# Patient Record
Sex: Female | Born: 1970 | Race: Black or African American | Hispanic: No | Smoking: Never smoker
Health system: Southern US, Community
[De-identification: ages and names within clinical notes are randomized; demographics above are authoritative.]

## PROBLEM LIST (undated history)

## (undated) ENCOUNTER — Emergency Department (HOSPITAL_COMMUNITY): Payer: Self-pay | Source: Home / Self Care

## (undated) ENCOUNTER — Inpatient Hospital Stay (HOSPITAL_COMMUNITY): Payer: Self-pay

## (undated) DIAGNOSIS — M1990 Unspecified osteoarthritis, unspecified site: Secondary | ICD-10-CM

## (undated) DIAGNOSIS — T7840XA Allergy, unspecified, initial encounter: Secondary | ICD-10-CM

## (undated) DIAGNOSIS — G4730 Sleep apnea, unspecified: Secondary | ICD-10-CM

## (undated) DIAGNOSIS — H4710 Unspecified papilledema: Secondary | ICD-10-CM

## (undated) DIAGNOSIS — E559 Vitamin D deficiency, unspecified: Secondary | ICD-10-CM

## (undated) HISTORY — DX: Unspecified papilledema: H47.10

## (undated) HISTORY — DX: Sleep apnea, unspecified: G47.30

## (undated) HISTORY — DX: Allergy, unspecified, initial encounter: T78.40XA

## (undated) HISTORY — DX: Unspecified osteoarthritis, unspecified site: M19.90

## (undated) HISTORY — DX: Vitamin D deficiency, unspecified: E55.9

---

## 1999-08-13 ENCOUNTER — Other Ambulatory Visit: Admission: RE | Admit: 1999-08-13 | Discharge: 1999-08-13 | Payer: Self-pay | Admitting: Obstetrics and Gynecology

## 1999-12-11 ENCOUNTER — Encounter: Payer: Self-pay | Admitting: Obstetrics and Gynecology

## 1999-12-11 ENCOUNTER — Ambulatory Visit (HOSPITAL_COMMUNITY): Admission: RE | Admit: 1999-12-11 | Discharge: 1999-12-11 | Payer: Self-pay | Admitting: Obstetrics and Gynecology

## 2000-02-03 ENCOUNTER — Ambulatory Visit (HOSPITAL_COMMUNITY): Admission: RE | Admit: 2000-02-03 | Discharge: 2000-02-03 | Payer: Self-pay | Admitting: Obstetrics and Gynecology

## 2000-02-03 ENCOUNTER — Encounter: Payer: Self-pay | Admitting: Obstetrics and Gynecology

## 2000-05-02 ENCOUNTER — Inpatient Hospital Stay (HOSPITAL_COMMUNITY): Admission: AD | Admit: 2000-05-02 | Discharge: 2000-05-04 | Payer: Self-pay | Admitting: Obstetrics and Gynecology

## 2000-08-13 ENCOUNTER — Other Ambulatory Visit: Admission: RE | Admit: 2000-08-13 | Discharge: 2000-08-13 | Payer: Self-pay | Admitting: Obstetrics and Gynecology

## 2000-09-05 ENCOUNTER — Emergency Department (HOSPITAL_COMMUNITY): Admission: EM | Admit: 2000-09-05 | Discharge: 2000-09-05 | Payer: Self-pay | Admitting: Emergency Medicine

## 2001-07-07 ENCOUNTER — Other Ambulatory Visit: Admission: RE | Admit: 2001-07-07 | Discharge: 2001-07-07 | Payer: Self-pay | Admitting: Obstetrics and Gynecology

## 2002-09-07 ENCOUNTER — Other Ambulatory Visit: Admission: RE | Admit: 2002-09-07 | Discharge: 2002-09-07 | Payer: Self-pay | Admitting: Obstetrics and Gynecology

## 2003-10-25 ENCOUNTER — Other Ambulatory Visit: Admission: RE | Admit: 2003-10-25 | Discharge: 2003-10-25 | Payer: Self-pay | Admitting: Obstetrics and Gynecology

## 2004-10-31 ENCOUNTER — Other Ambulatory Visit: Admission: RE | Admit: 2004-10-31 | Discharge: 2004-10-31 | Payer: Self-pay | Admitting: Obstetrics and Gynecology

## 2005-11-03 ENCOUNTER — Other Ambulatory Visit: Admission: RE | Admit: 2005-11-03 | Discharge: 2005-11-03 | Payer: Self-pay | Admitting: Obstetrics and Gynecology

## 2006-07-31 ENCOUNTER — Emergency Department (HOSPITAL_COMMUNITY): Admission: EM | Admit: 2006-07-31 | Discharge: 2006-07-31 | Payer: Self-pay | Admitting: Family Medicine

## 2009-08-07 ENCOUNTER — Emergency Department (HOSPITAL_COMMUNITY): Admission: EM | Admit: 2009-08-07 | Discharge: 2009-08-07 | Payer: Self-pay | Admitting: Emergency Medicine

## 2010-06-25 LAB — POCT URINALYSIS DIP (DEVICE)
Glucose, UA: NEGATIVE mg/dL
Hgb urine dipstick: NEGATIVE
Nitrite: NEGATIVE
Protein, ur: 30 mg/dL — AB
Specific Gravity, Urine: >=1.03 (ref 1.005–1.030)
Urobilinogen, UA: 2 mg/dL — ABNORMAL HIGH (ref 0.0–1.0)
pH: 5.5 (ref 5.0–8.0)

## 2010-06-25 LAB — URINE CULTURE: Colony Count: 35000
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
## 2011-10-29 ENCOUNTER — Telehealth: Payer: Self-pay | Admitting: Obstetrics and Gynecology

## 2011-10-29 NOTE — Telephone Encounter (Signed)
sr pt 

## 2011-11-04 ENCOUNTER — Other Ambulatory Visit: Payer: Self-pay | Admitting: Obstetrics and Gynecology

## 2011-11-04 ENCOUNTER — Telehealth: Payer: Self-pay

## 2011-11-04 DIAGNOSIS — Z1231 Encounter for screening mammogram for malignant neoplasm of breast: Secondary | ICD-10-CM

## 2011-11-04 NOTE — Telephone Encounter (Signed)
Pt called requesting info on where to get mmg done.  Gave pt [facility] phone number so pt can schedule at her convenience.  ld

## 2011-11-06 ENCOUNTER — Other Ambulatory Visit: Payer: Self-pay | Admitting: Podiatry

## 2011-11-06 DIAGNOSIS — R52 Pain, unspecified: Secondary | ICD-10-CM

## 2011-11-10 ENCOUNTER — Other Ambulatory Visit: Payer: Self-pay

## 2011-11-12 ENCOUNTER — Ambulatory Visit
Admission: RE | Admit: 2011-11-12 | Discharge: 2011-11-12 | Disposition: A | Discharge status: Home / Self Care | Payer: BC Managed Care – PPO | Source: Ambulatory Visit | Attending: Podiatry | Admitting: Podiatry

## 2011-11-12 DIAGNOSIS — R52 Pain, unspecified: Secondary | ICD-10-CM

## 2011-11-18 ENCOUNTER — Ambulatory Visit
Admission: RE | Admit: 2011-11-18 | Discharge: 2011-11-18 | Disposition: A | Discharge status: Home / Self Care | Payer: BC Managed Care – PPO | Source: Ambulatory Visit | Attending: Obstetrics and Gynecology | Admitting: Obstetrics and Gynecology

## 2011-11-18 DIAGNOSIS — Z1231 Encounter for screening mammogram for malignant neoplasm of breast: Secondary | ICD-10-CM

## 2012-04-28 ENCOUNTER — Ambulatory Visit: Payer: BC Managed Care – PPO | Admitting: Obstetrics and Gynecology

## 2012-04-28 ENCOUNTER — Encounter: Payer: Self-pay | Admitting: Obstetrics and Gynecology

## 2012-04-28 VITALS — BP 126/80 | Ht 68.0 in | Wt 293.0 lb

## 2012-04-28 DIAGNOSIS — Z01419 Encounter for gynecological examination (general) (routine) without abnormal findings: Secondary | ICD-10-CM

## 2012-04-28 DIAGNOSIS — N912 Amenorrhea, unspecified: Secondary | ICD-10-CM

## 2012-04-28 DIAGNOSIS — Z124 Encounter for screening for malignant neoplasm of cervix: Secondary | ICD-10-CM

## 2012-04-28 LAB — THYROID PANEL WITH TSH
Free Thyroxine Index: 2.6 (ref 1.0–3.9)
TSH: 1.142 u[IU]/mL (ref 0.350–4.500)

## 2012-04-28 LAB — POCT URINE PREGNANCY: Preg Test, Ur: NEGATIVE

## 2012-04-28 MED ORDER — MEDROXYPROGESTERONE ACETATE 10 MG PO TABS: ORAL_TABLET | ORAL | Status: DC

## 2012-04-28 NOTE — Progress Notes (Signed)
The patient reports:irregular cycles  Contraception:None   Last mammogram: 11/19/2011 Normal Last pap: 04/24/2011 Normal  GC/Chlamydia cultures offered: declined HIV/RPR/HbsAg offered:  declined HSV 1 and 2 glycoprotein offered: declined  Menstrual cycle regular and monthly: No Pt states last cycle was October 28,2013 but pt feels breast tenderness and mild cramping but no cycle  Menstrual flow normal: No it is really heavy lasts 3-4 days    Urinary symptoms: none Normal bowel movements: Yes Reports abuse at home: No:   Subjective:    Katherine Conway is a 42 y.o. female, G3P2, who presents for an annual exam.     History   Social History  . Marital Status: Married    Spouse Name: N/A    Number of Children: N/A  . Years of Education: N/A   Social History Main Topics  . Smoking status: Never Smoker   . Smokeless tobacco: Never Used  . Alcohol Use: Yes     Comment: occasional wine   . Drug Use: No  . Sexually Active: Not Currently   Other Topics Concern  . None   Social History Narrative  . None    Menstrual cycle:   LMP: Patient's last menstrual period was 01/22/2012.           Cycle: Irregular cycles. No cycle since 01/2012  The following portions of the patient's history were reviewed and updated as appropriate: allergies, current medications, past family history, past medical history, past social history, past surgical history and problem list.  Review of Systems Pertinent items are noted in HPI. Breast:Negative for breast lump,nipple discharge or nipple retraction Gastrointestinal: Negative for abdominal pain, change in bowel habits or rectal bleeding Urinary:negative   Objective:    BP 126/80  Ht 5\' 8"  (1.727 m)  Wt 293 lb (132.904 kg)  BMI 44.55 kg/m2  LMP 01/22/2012    Weight:  Wt Readings from Last 1 Encounters:  04/28/12 293 lb (132.904 kg)          BMI: Body mass index is 44.55 kg/(m^2).  General Appearance: Alert, appropriate appearance for  age. No acute distress HEENT: Grossly normal Neck / Thyroid: Supple, no masses, nodes or enlargement Lungs: clear to auscultation bilaterally Back: No CVA tenderness Breast Exam: No dimpling, nipple retraction or discharge. No masses or nodes. and No masses or nodes.No dimpling, nipple retraction or discharge. Cardiovascular: Regular rate and rhythm. S1, S2, no murmur Gastrointestinal: Soft, non-tender, no masses or organomegaly Pelvic Exam: Vulva and vagina appear normal. Bimanual exam reveals normal uterus and adnexa. Rectovaginal: normal rectal, no masses Lymphatic Exam: Non-palpable nodes in neck, clavicular, axillary, or inguinal regions Skin: no rash or abnormalities Neurologic: Normal gait and speech, no tremor  Psychiatric: Alert and oriented, appropriate affect.   Assessment:    Normal gyn exam  Anovulatory bleeding   Plan:    mammogram pap smear return annually or prn STD screening: declined Contraception:no method   15  minutes face-to-face discussion on ANOVULATORY BLEEDING / PCOS with:  1. Etiology: hormonal vs unexplained 2. Need to cycle every 60-90 days to avoid hyperplasia 3. May resolve on its own 4. May need ovulation induction if desires pregnancy 5. May choose to treat with cyclic progesterone supplementation monthly or PRN 6. Can be associated with increased female hormone production with subsequent excessive hair growth 7. Can be associated with metabolic syndrome +/- Insulin resistance   Thyroid Panel with TSH, Prolactin, Progesterone Provera 10 Mg qhs x 10 days, every 60 days prn  Silverio Lay MD

## 2012-04-29 LAB — PAP IG W/ RFLX HPV ASCU

## 2012-04-29 LAB — PROLACTIN: Prolactin: 6.3 ng/mL

## 2012-05-17 ENCOUNTER — Telehealth: Payer: Self-pay | Admitting: Obstetrics and Gynecology

## 2012-05-17 DIAGNOSIS — N912 Amenorrhea, unspecified: Secondary | ICD-10-CM

## 2012-05-17 NOTE — Telephone Encounter (Signed)
LVM for pt to return call.   Jeshawn Melucci, CMA  

## 2012-05-17 NOTE — Telephone Encounter (Signed)
Please have patient come in for [facility] (unless you can add to previously ordered panel). We normally wait 45 days and repeat a course of Provera if no bleeding. OK to refill x 1

## 2012-05-17 NOTE — Telephone Encounter (Signed)
Spoke with pt. States that she started Progesterone after last visit 01/22 finished on 02/01. States that she has not started a cycle yet. Is having intermittent mild cramping since stopping progesterone. No d/c or bleeding. No other symptoms.   Darien Ramus, CMA

## 2012-05-18 NOTE — Telephone Encounter (Signed)
Advised pt of plan. Scheduled for [facility] on 05/19/12.  Refill sent to pharmacy for refill of Provera. Pt aware to start if no cycle after 45 days  Darien Ramus, CMA

## 2012-05-19 ENCOUNTER — Other Ambulatory Visit: Payer: BC Managed Care – PPO

## 2012-05-19 DIAGNOSIS — N912 Amenorrhea, unspecified: Secondary | ICD-10-CM

## 2012-05-19 LAB — FOLLICLE STIMULATING HORMONE: FSH: 68.5 m[IU]/mL

## 2012-05-22 ENCOUNTER — Other Ambulatory Visit: Payer: Self-pay

## 2012-05-22 NOTE — Progress Notes (Signed)
Quick Note:  FSH compatible with premature menopause. Schedule follow-up after 2nd course of Provera ______

## 2012-05-24 ENCOUNTER — Telehealth: Payer: Self-pay

## 2012-05-24 NOTE — Telephone Encounter (Signed)
LVM for pt to return call.   Rudolf Blizard, CMA  

## 2012-05-24 NOTE — Telephone Encounter (Signed)
Message copied by Darien Ramus on Mon May 24, 2012  9:22 AM ------      Message from: Silverio Lay      Created: Sat May 22, 2012  1:09 PM       [facility] compatible with premature menopause. Schedule follow-up after 2nd course of Provera ------

## 2012-05-25 NOTE — Telephone Encounter (Signed)
Pt aware of lab work. Voiced understanding. Pt will call back when beginning her 2nd dose of provera after 45 days from last dosage to schedule f/u with SR.  Darien Ramus, CMA

## 2012-07-15 ENCOUNTER — Emergency Department (HOSPITAL_COMMUNITY)
Admission: EM | Admit: 2012-07-15 | Discharge: 2012-07-15 | Disposition: A | Discharge status: Home / Self Care | Payer: BC Managed Care – PPO | Source: Home / Self Care

## 2012-07-15 ENCOUNTER — Encounter (HOSPITAL_COMMUNITY): Payer: Self-pay | Admitting: *Deleted

## 2012-07-15 DIAGNOSIS — S39013A Strain of muscle, fascia and tendon of pelvis, initial encounter: Secondary | ICD-10-CM

## 2012-07-15 DIAGNOSIS — S339XXA Sprain of unspecified parts of lumbar spine and pelvis, initial encounter: Secondary | ICD-10-CM

## 2012-07-15 DIAGNOSIS — IMO0002 Reserved for concepts with insufficient information to code with codable children: Secondary | ICD-10-CM

## 2012-07-15 DIAGNOSIS — S39012A Strain of muscle, fascia and tendon of lower back, initial encounter: Secondary | ICD-10-CM

## 2012-07-15 LAB — POCT URINALYSIS DIP (DEVICE)
Glucose, UA: NEGATIVE mg/dL
Hgb urine dipstick: NEGATIVE
Leukocytes, UA: NEGATIVE
Nitrite: NEGATIVE
Urobilinogen, UA: 2 mg/dL — ABNORMAL HIGH (ref 0.0–1.0)

## 2012-07-15 MED ORDER — KETOROLAC TROMETHAMINE 60 MG/2ML IM SOLN
INTRAMUSCULAR | Status: AC
  Filled 2012-07-15: qty 2

## 2012-07-15 MED ORDER — KETOROLAC TROMETHAMINE 60 MG/2ML IM SOLN
60.0000 mg | Freq: Once | INTRAMUSCULAR | Status: AC
  Administered 2012-07-15: 60 mg via INTRAMUSCULAR

## 2012-07-15 MED ORDER — TRAMADOL HCL 50 MG PO TABS: 50.0000 mg | ORAL_TABLET | Freq: Four times a day (QID) | ORAL | Status: DC | PRN

## 2012-07-15 MED ORDER — DICLOFENAC SODIUM 75 MG PO TBEC: 75.0000 mg | DELAYED_RELEASE_TABLET | Freq: Two times a day (BID) | ORAL | Status: DC

## 2012-07-15 NOTE — ED Notes (Signed)
Pt reports frequent urination, lower back pain - denies discharge or any chance of STD exposure - feels similar to last time pt had UTI

## 2012-07-15 NOTE — ED Provider Notes (Signed)
History     CSN: [id number]  Arrival date & time 07/15/12  1151   First MD Initiated Contact with Patient 07/15/12 1249      Chief Complaint  Patient presents with  . Urinary Tract Infection    (Consider location/radiation/quality/duration/timing/severity/associated sxs/prior treatment) HPI Comments: 42 year old morbidly obese female experienced pain in the left inguinal region radiating to the left back last night. The pain is worse with movement in various positions. She is unaware of any known trauma, movement or event that precipitated the pain.   Past Medical History  Diagnosis Date  . Allergy     Past Surgical History  Procedure Laterality Date  . Cesarean section      Family History  Problem Relation Age of Onset  . Hypertension Mother   . Hypertension Father   . Breast cancer Maternal Aunt   . Hypertension Maternal Grandmother   . Hypertension Maternal Grandfather   . Hypertension Paternal Grandmother   . Heart disease Paternal Grandfather   . Hypertension Paternal Grandfather     History  Substance Use Topics  . Smoking status: Never Smoker   . Smokeless tobacco: Never Used  . Alcohol Use: Yes     Comment: occasional wine     OB History   Grav Para Term Preterm Abortions TAB SAB Ect Mult Living   3 2              Review of Systems  Constitutional: Negative for fever, chills and activity change.  HENT: Negative.   Respiratory: Negative.   Cardiovascular: Negative.   Genitourinary: Positive for frequency. Negative for dysuria, urgency, flank pain, decreased urine volume, vaginal bleeding, vaginal discharge, enuresis and pelvic pain.  Musculoskeletal:       As per HPI  Skin: Negative for color change, pallor and rash.  Neurological: Negative.     Allergies  Review of patient's allergies indicates no known allergies.  Home Medications   Current Outpatient Rx  Name  Route  Sig  Dispense  Refill  . diclofenac (VOLTAREN) 75 MG EC tablet  Oral   Take 1 tablet (75 mg total) by mouth 2 (two) times daily. Take with food   20 tablet   0   . Loratadine (CLARITIN PO)   Oral   Take by mouth.         . medroxyPROGESTERone (PROVERA) 10 MG tablet      One po at bedtime x 10 day. Every 60 days prn   30 tablet   4   . naproxen sodium (ANAPROX) 220 MG tablet   Oral   Take 220 mg by mouth 2 (two) times daily with a meal.         . traMADol (ULTRAM) 50 MG tablet   Oral   Take 1 tablet (50 mg total) by mouth every 6 (six) hours as needed for pain.   15 tablet   0     BP 139/83  Pulse 69  Temp(Src) 98.3 F (36.8 C) (Oral)  Resp 18  SpO2 98%  Physical Exam  Nursing note and vitals reviewed. Constitutional: She is oriented to person, place, and time. She appears well-developed and well-nourished. No distress.  HENT:  Head: Normocephalic and atraumatic.  Eyes: EOM are normal.  Neck: Normal range of motion. Neck supple.  Cardiovascular: Normal rate, regular rhythm and normal heart sounds.   Pulmonary/Chest: Effort normal and breath sounds normal.  Abdominal: Soft. She exhibits no distension and no mass. There is no tenderness.  There is no rebound and no guarding.  Musculoskeletal: She exhibits tenderness. She exhibits no edema.  Pain in the left low back and groin is reproduced with leaning forward into the right. There is tenderness along the lower paralumbar and parasacral musculature. When lying down left straight leg raise produces moderate to severe pain in the inguinal ligaments. These ligaments are also tender. No tenderness over the pelvis or suprapubic area.  Neurological: She is alert and oriented to person, place, and time. No cranial nerve deficit.  Skin: Skin is warm and dry.  Psychiatric: She has a normal mood and affect.    ED Course  Procedures (including critical care time)  Labs Reviewed  POCT URINALYSIS DIP (DEVICE) - Abnormal; Notable for the following:    Urobilinogen, UA 2.0 (*)    All  other components within normal limits   No results found.   1. Inguinal muscle strain, initial encounter   2. Lumbosacral strain, initial encounter       MDM  Heat to the areas of soreness. Toradol 60 mg IM Diclofenac 75 mg twice a day with food when necessary pain Tramadol 50 mg Q6 hours when necessary pain #15 Limit is activity which worsened the pain such as climbing stairs, bending excessive walking. Followup with your primary care doctor next week as needed. For any new symptoms problems or worsening may return        Hayden Rasmussen, NP 07/15/12 1350

## 2012-07-15 NOTE — ED Provider Notes (Signed)
Medical screening examination/treatment/procedure(s) were performed by non-physician practitioner and as supervising physician I was immediately available for consultation/collaboration.  Leslee Home, M.D.  Reuben Likes, MD 07/15/12 2209

## 2013-02-10 ENCOUNTER — Other Ambulatory Visit: Payer: Self-pay

## 2013-09-29 ENCOUNTER — Other Ambulatory Visit: Payer: Self-pay

## 2013-09-29 DIAGNOSIS — Z1231 Encounter for screening mammogram for malignant neoplasm of breast: Secondary | ICD-10-CM

## 2013-10-05 ENCOUNTER — Ambulatory Visit
Admission: RE | Admit: 2013-10-05 | Discharge: 2013-10-05 | Disposition: A | Discharge status: Home / Self Care | Payer: BC Managed Care – PPO | Source: Ambulatory Visit

## 2013-10-05 DIAGNOSIS — Z1231 Encounter for screening mammogram for malignant neoplasm of breast: Secondary | ICD-10-CM

## 2014-02-06 ENCOUNTER — Encounter (HOSPITAL_COMMUNITY): Payer: Self-pay | Admitting: *Deleted

## 2015-01-27 ENCOUNTER — Ambulatory Visit (INDEPENDENT_AMBULATORY_CARE_PROVIDER_SITE_OTHER): Payer: BC Managed Care – PPO | Admitting: Internal Medicine

## 2015-01-27 ENCOUNTER — Ambulatory Visit (INDEPENDENT_AMBULATORY_CARE_PROVIDER_SITE_OTHER): Payer: BC Managed Care – PPO

## 2015-01-27 VITALS — BP 124/80 | HR 75 | Temp 98.7°F | Resp 18 | Ht 68.5 in | Wt 292.0 lb

## 2015-01-27 DIAGNOSIS — M79674 Pain in right toe(s): Secondary | ICD-10-CM

## 2015-01-27 DIAGNOSIS — W880XXA Exposure to X-rays, initial encounter: Secondary | ICD-10-CM | POA: Diagnosis not present

## 2015-01-27 LAB — POCT URINE PREGNANCY: PREG TEST UR: NEGATIVE

## 2015-01-27 NOTE — Progress Notes (Signed)
   Subjective:    Patient ID: Katherine Conway, female    DOB: 10/15/1970, 44 y.o.   MRN: [id number] This chart was scribed for Ellamae Siaobert Noriah Osgood, MD by Jolene Provostobert Halas, Medical Scribe. This patient was seen in Room 3 and the patient's care was started a 9:11 AMa.  Chief Complaint  Patient presents with  . Toe Injury    last night, right foot, pinky toe    HPI HPI Comments: Katherine Conway is a 44 y.o. female [facility] of pain in her right fifth toe after catching it on furniture while walking around her house last night. She has pain with movement of her toe.   Review of Systems  Constitutional: Negative for fever and chills.  Musculoskeletal: Positive for joint swelling and gait problem.  Skin: Negative for color change and wound.  Neurological: Positive for weakness (Secondary to pain).       Objective:   Physical Exam  Constitutional: She is oriented to person, place, and time. She appears well-developed and well-nourished. No distress.  HENT:  Head: Normocephalic and atraumatic.  Eyes: Pupils are equal, round, and reactive to light.  Neck: Neck supple.  Cardiovascular: Normal rate.   Pulmonary/Chest: Effort normal. No respiratory distress.  Musculoskeletal: Normal range of motion.  Right fifth toe was swollen but not misaligned. There is pain palpation of the MTP and IP joints and pain with any range of motion.  Neurological: She is alert and oriented to person, place, and time. Coordination normal.  Skin: Skin is warm and dry. She is not diaphoretic.  Psychiatric: She has a normal mood and affect. Her behavior is normal.  Nursing note and vitals reviewed.   Filed Vitals:   01/27/15 0854  BP: 124/80  Pulse: 75  Temp: 98.7 F (37.1 C)  Resp: 18  Height: 5' 8.5" (1.74 m)  Weight: 292 lb (132.45 kg)  SpO2: 98%   [facility] reading (PRIMARY) by  Dr. Merla Richesoolittle. No fracture or dislocation.  Results for orders placed or performed in visit on 01/27/15    POCT urine pregnancy  Result Value Ref Range   Preg Test, Ur Negative Negative       Assessment & Plan:  Pain of toe of right foot - Sprain is  Plan: Buddy tape til pain free  footwaer w/out pressure  Ice tid swelling  Exposure to x-rays, initial encounter - Plan: POCT urine pregnancy  I have completed the patient encounter in its entirety as documented by the scribe, with editing by me where necessary. Mandel Seiden P. Merla Richesoolittle, M.D.   By signing my name below, I, Javier Dockerobert Ryan Halas, attest that this documentation has been prepared under the direction and in the presence of Ellamae Siaobert Paulanthony Gleaves, MD. Electronically Signed: Javier Dockerobert Ryan Halas, ER Scribe. 01/27/2015. 9:12 AM.

## 2015-09-05 LAB — HEPATIC FUNCTION PANEL
ALT: 16 (ref 7–35)
AST: 16 (ref 13–35)
Alkaline Phosphatase: 75 (ref 25–125)

## 2015-09-05 LAB — BASIC METABOLIC PANEL
BUN: 19 (ref 4–21)
Glucose: 87
POTASSIUM: 4 (ref 3.4–5.3)
SODIUM: 140 (ref 137–147)

## 2015-09-05 LAB — VITAMIN D 25 HYDROXY (VIT D DEFICIENCY, FRACTURES): VIT D 25 HYDROXY: 7.7

## 2015-09-05 LAB — TSH: TSH: 2.11 (ref 0.41–5.90)

## 2015-09-05 LAB — HEMOGLOBIN A1C: Hemoglobin A1C: 5.9

## 2017-07-15 ENCOUNTER — Ambulatory Visit: Payer: Managed Care, Other (non HMO) | Admitting: Family Medicine

## 2017-07-15 ENCOUNTER — Ambulatory Visit (INDEPENDENT_AMBULATORY_CARE_PROVIDER_SITE_OTHER)
Admission: RE | Admit: 2017-07-15 | Discharge: 2017-07-15 | Disposition: A | Discharge status: Home / Self Care | Payer: Managed Care, Other (non HMO) | Source: Ambulatory Visit | Attending: Family Medicine | Admitting: Family Medicine

## 2017-07-15 ENCOUNTER — Encounter: Payer: Self-pay | Admitting: Family Medicine

## 2017-07-15 ENCOUNTER — Other Ambulatory Visit: Payer: Self-pay

## 2017-07-15 ENCOUNTER — Ambulatory Visit: Payer: Self-pay

## 2017-07-15 VITALS — BP 124/86 | HR 74 | Ht 68.0 in | Wt 309.0 lb

## 2017-07-15 DIAGNOSIS — M25562 Pain in left knee: Principal | ICD-10-CM

## 2017-07-15 DIAGNOSIS — G8929 Other chronic pain: Secondary | ICD-10-CM | POA: Diagnosis not present

## 2017-07-15 DIAGNOSIS — M1712 Unilateral primary osteoarthritis, left knee: Secondary | ICD-10-CM | POA: Insufficient documentation

## 2017-07-15 MED ORDER — DICLOFENAC SODIUM 2 % TD SOLN: 2.0000 g | Freq: Two times a day (BID) | TRANSDERMAL | 3 refills | Status: DC

## 2017-07-15 NOTE — Assessment & Plan Note (Signed)
Patellofemoral.  Discussed icing regimen and home exercises.  Discussed which activities to doing which wants to avoid.  Patient will increase activity as tolerated.  Topical anti-inflammatories prescribed.  X-rays pending secondary to the calcific changes.  Stability brace given.  Follow-up again in 4 weeks.  If still having effusion consider aspiration

## 2017-07-15 NOTE — Progress Notes (Signed)
[name] [address] [place], [place] [address] Phone: [phone] Subjective:      CC: Knee pain, left   NUU:[id number]  Katherine Conway is a 47 y.o. female coming in with complaint of knee pain. Patient is having chronic left knee pain. Her pain is over the anterior portion of the knee. Squatting hurts as well as sitting with her knees flexed. Dull intermittent pain. No history of injury or surgeries. Patient does have a decrease in her pain when she goes to the gym. Wearing heels exacerbates her pain.  Patient has noticed maybe some swelling.  States that she is still able to do daily activities.  Still going into the gym but if she attempts to squat to far she has worsening discomfort and pain.      Past Medical History:  Diagnosis Date  . Allergy    Past Surgical History:  Procedure Laterality Date  . CESAREAN SECTION     Social History   Socioeconomic History  . Marital status: Married    Spouse name: Not on file  . Number of children: Not on file  . Years of education: Not on file  . Highest education level: Not on file  Occupational History  . Not on file  Social Needs  . Financial resource strain: Not on file  . Food insecurity:    Worry: Not on file    Inability: Not on file  . Transportation needs:    Medical: Not on file    Non-medical: Not on file  Tobacco Use  . Smoking status: Never Smoker  . Smokeless tobacco: Never Used  Substance and Sexual Activity  . Alcohol use: Yes    Comment: occasional wine   . Drug use: No  . Sexual activity: Not Currently  Lifestyle  . Physical activity:    Days per week: Not on file    Minutes per session: Not on file  . Stress: Not on file  Relationships  . Social connections:    Talks on phone: Not on file    Gets together: Not on file    Attends religious service: Not on file    Active member of club or organization: Not on file    Attends meetings of clubs or organizations:  Not on file    Relationship status: Not on file  Other Topics Concern  . Not on file  Social History Narrative  . Not on file   No Known Allergies Family History  Problem Relation Age of Onset  . Hypertension Mother   . Hypertension Father   . Breast cancer Maternal Aunt   . Hypertension Maternal Grandmother   . Hypertension Maternal Grandfather   . Hypertension Paternal Grandmother   . Heart disease Paternal Grandfather   . Hypertension Paternal Grandfather      Past medical history, social, surgical and family history all reviewed in electronic medical record.  No pertanent information unless stated regarding to the chief complaint.   Review of Systems:Review of systems updated and as accurate as of 07/15/17  No headache, visual changes, nausea, vomiting, diarrhea, constipation, dizziness, abdominal pain, skin rash, fevers, chills, night sweats, weight loss, swollen lymph nodes, body aches,  chest pain, shortness of breath, mood changes.  Positive muscle aches possible joint swelling  Objective  Blood pressure 124/86, pulse 74, height 5\' 8"  (1.727 m), weight (!) 309 lb (140.2 kg), SpO2 96 %. Systems examined below as of 07/15/17   General: No  apparent distress alert and oriented x3 mood and affect normal, dressed appropriately.  HEENT: Pupils equal, extraocular movements intact  Respiratory: Patient's speak in full sentences and does not appear short of breath  Cardiovascular: No lower extremity edema, non tender, no erythema  Skin: Warm dry intact with no signs of infection or rash on extremities or on axial skeleton.  Abdomen: Soft nontender  Neuro: Cranial nerves II through XII are intact, neurovascularly intact in all extremities with 2+ DTRs and 2+ pulses.  Lymph: No lymphadenopathy of posterior or anterior cervical chain or axillae bilaterally.  Gait normal with good balance and coordination.  MSK:  Non tender with full range of motion and good stability and symmetric  strength and tone of shoulders, elbows, wrist, hip, and ankles bilaterally.  Knee: Left Normal to inspection Difficult to asses secondary to body habitus.  Pain over PT joint lateral  ROM full in flexion and extension and lower leg rotation. Mild laxity of the left MCL but end point noted.  Negative Mcmurray's, Apley's, and Thessalonian tests. Non painful patellar compression. Patellar glide with moderate to severe crepitus. Patellar and quadriceps tendons unremarkable. Hamstring and quadriceps strength is normal.  MSK US performed of: Left This study was ordered, performed, and interpreted by Terrilee FilesZach Smith D.O.  Knee: Moderate effusion noted with calcific changes of the patellofemoral joint.  Significant narrowing of the patellofemoral and moderate narrowing of the medial joint space.  Meniscus appear to be intact.  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  This included exercises focusing on stretching, strengthening, with significant focus on eccentric aspects.   Long term goals include an improvement in range of motion, strength, endurance as well as avoiding reinjury. Patient's frequency would include in 1-2 times a day, 3-5 times a week for a duration of 6-12 weeks. Patellofemoral Syndrome  Reviewed anatomy using anatomical model and how PFS occurs.  Given rehab exercises handout for VMO, hip abductors, core, entire kinetic chain including proprioception exercises including cone touches, step downs, hip elevations and turn outs.  Could benefit from PT, regular exercise, upright biking, and a PFS knee brace to assist with tracking abnormalities.  Proper technique shown and discussed handout in great detail with ATC.  All questions were discussed and answered.      Impression and Recommendations:     This case required  medical decision making of moderate complexity.      Note: This dictation was prepared with Dragon dictation along with smaller phrase technology. Any transcriptional errors that result from this process are unintentional.

## 2017-07-15 NOTE — Patient Instructions (Signed)
Good to see you.  Ice 20 minutes 2 times daily. Usually after activity and before bed. Exercises 3 times a week.  Xray downstairs Over the counter try  Turmeric 500mg  daily  Tart cherry extract 1200mg  at night Wear brace with a lot of activity  See me again in 4-5 weeks

## 2017-08-16 NOTE — Progress Notes (Signed)
Zach Lulabelle Desta D.O. [facility] [address] [place], [place] [address] Phone: [phone] Subjective:     CC: Left knee pain follow-up  BJY:[id number]  NEVEAH BANG is a 47 y.o. female coming in with complaint of knee pain. She has had improvement since last visit. She did wear heels yesterday did cause some pain. She said she has been able to exercise more without pain.  Patient was found to have more of a patellofemoral syndrome.  Patient states that the topical anti-inflammatories and home exercises have been very helpful.  States that she is feeling 75 to 80% better.      Past Medical History:  Diagnosis Date  . Allergy    Past Surgical History:  Procedure Laterality Date  . CESAREAN SECTION     Social History   Socioeconomic History  . Marital status: Married    Spouse name: Not on file  . Number of children: Not on file  . Years of education: Not on file  . Highest education level: Not on file  Occupational History  . Not on file  Social Needs  . Financial resource strain: Not on file  . Food insecurity:    Worry: Not on file    Inability: Not on file  . Transportation needs:    Medical: Not on file    Non-medical: Not on file  Tobacco Use  . Smoking status: Never Smoker  . Smokeless tobacco: Never Used  Substance and Sexual Activity  . Alcohol use: Yes    Comment: occasional wine   . Drug use: No  . Sexual activity: Not Currently  Lifestyle  . Physical activity:    Days per week: Not on file    Minutes per session: Not on file  . Stress: Not on file  Relationships  . Social connections:    Talks on phone: Not on file    Gets together: Not on file    Attends religious service: Not on file    Active member of club or organization: Not on file    Attends meetings of clubs or organizations: Not on file    Relationship status: Not on file  Other Topics Concern  . Not on file  Social History Narrative  . Not on file   No Known  Allergies Family History  Problem Relation Age of Onset  . Hypertension Mother   . Hypertension Father   . Breast cancer Maternal Aunt   . Hypertension Maternal Grandmother   . Hypertension Maternal Grandfather   . Hypertension Paternal Grandmother   . Heart disease Paternal Grandfather   . Hypertension Paternal Grandfather      Past medical history, social, surgical and family history all reviewed in electronic medical record.  No pertanent information unless stated regarding to the chief complaint.   Review of Systems:Review of systems updated and as accurate as of 08/17/17  No headache, visual changes, nausea, vomiting, diarrhea, constipation, dizziness, abdominal pain, skin rash, fevers, chills, night sweats, weight loss, swollen lymph nodes, body aches, joint swelling, muscle aches, chest pain, shortness of breath, mood changes.   Objective  Blood pressure (!) 138/92, pulse 69, height  (1.727 m), weight (!) 307 lb (139.3 kg), SpO2 94 %. Systems examined below as of 08/17/17   General: No apparent distress alert and oriented x3 mood and affect normal, dressed appropriately.  HEENT: Pupils equal, extraocular movements inTawana Scaleatient's speak in full sentences and does not appear short of breath  Cardiovascular: No lower extremity edema, non tender, no erythema  Skin: Warm dry intact with no signs of infection or rash on extremities or on axial skeleton.  Abdomen: Soft nontender  Neuro: Cranial nerves II through XII are intact, neurovascularly intact in all extremities with 2+ DTRs and 2+ pulses.  Lymph: No lymphadenopathy of posterior or anterior cervical chain or axillae bilaterally.  Gait normal with good balance and coordination.  MSK:  Non tender with full range of motion and good stability and symmetric strength and tone of shoulders, elbows, wrist, hip, and ankles bilaterally.   Left knee exam shows the patient still has some mild lateral tracking noted  with some very mild grinding noted.  Positive patella grind.  No swelling of the today.    Impression and Recommendations:     This case required medical decision making of moderate complexity.      Note: This dictation was prepared with Dragon dictation along with smaller phrase technology. Any transcriptional errors that result from this process are unintentional.

## 2017-08-17 ENCOUNTER — Encounter: Payer: Self-pay | Admitting: Family Medicine

## 2017-08-17 ENCOUNTER — Ambulatory Visit: Payer: Managed Care, Other (non HMO) | Admitting: Family Medicine

## 2017-08-17 DIAGNOSIS — M1712 Unilateral primary osteoarthritis, left knee: Secondary | ICD-10-CM | POA: Diagnosis not present

## 2017-08-17 NOTE — Patient Instructions (Signed)
You are awesome! Keep it up  Every pound you lose is 4 pounds to your knees Ice 20 minutes 2 times daily. Usually after activity and before bed. pennsaid pinkie amount topically 2 times daily as needed.  Try to do the exercises 1-2 times a week  See em again in 6 weeks if you need me

## 2017-08-17 NOTE — Assessment & Plan Note (Signed)
Patient is doing remarkably well at this time.  Discussed icing regimen and home exercise.  Discussed which activities to do which wants to avoid.  Patient is to increase activity as tolerated.  Follow-up again in 4 to 6 weeks

## 2017-09-28 ENCOUNTER — Ambulatory Visit: Payer: Managed Care, Other (non HMO) | Admitting: Family Medicine

## 2017-10-19 NOTE — Progress Notes (Signed)
Tawana ScaleZach Nevin Grizzle D.O. [facility] [address] [place], [place] [id number] Phone: [phone] Subjective:     CC: Left knee pain  IEP:PIRJJOACZYHPI:Subjective  Katherine Conway is a 47 y.o. female coming in with complaint of left knee pain. States she sat on the bed and she thinks she twisted it. [place] Had xrays taken and was told it was soft tissue. Has been using an immobilizer and knee brace. Hasn't been bending her knee. No swelling noted.   Onset- Sunday Location- Lateral Duration-   Character- Sharp Aggravating factors- Walking  Reliving factors-  Therapies tried- Ice, aleve Severity-initially 9 out of 10.  Was seen in the emergency room.  Put in a knee immobilizer and was sent here.     Past Medical History:  Diagnosis Date  . Allergy    Past Surgical History:  Procedure Laterality Date  . CESAREAN SECTION     Social History   Socioeconomic History  . Marital status: Divorced    Spouse name: Not on file  . Number of children: Not on file  . Years of education: Not on file  . Highest education level: Not on file  Occupational History  . Not on file  Social Needs  . Financial resource strain: Not on file  . Food insecurity:    Worry: Not on file    Inability: Not on file  . Transportation needs:    Medical: Not on file    Non-medical: Not on file  Tobacco Use  . Smoking status: Never Smoker  . Smokeless tobacco: Never Used  Substance and Sexual Activity  . Alcohol use: Yes    Comment: occasional wine   . Drug use: No  . Sexual activity: Not Currently  Lifestyle  . Physical activity:    Days per week: Not on file    Minutes per session: Not on file  . Stress: Not on file  Relationships  . Social connections:    Talks on phone: Not on file    Gets together: Not on file    Attends religious service: Not on file    Active member of club or organization: Not on file    Attends meetings of clubs or organizations: Not on file   Relationship status: Not on file  Other Topics Concern  . Not on file  Social History Narrative  . Not on file   No Known Allergies Family History  Problem Relation Age of Onset  . Hypertension Mother   . Hypertension Father   . Breast cancer Maternal Aunt   . Hypertension Maternal Grandmother   . Hypertension Maternal Grandfather   . Hypertension Paternal Grandmother   . Heart disease Paternal Grandfather   . Hypertension Paternal Grandfather      Past medical history, social, surgical and family history all reviewed in electronic medical record.  No pertanent information unless stated regarding to the chief complaint.   Review of Systems:Review of systems updated and as accurate as of 10/20/17  No headache, visual changes, nausea, vomiting, diarrhea, constipation, dizziness, abdominal pain, skin rash, fevers, chills, night sweats, weight loss, swollen lymph nodes, body aches, joint swelling, muscle aches, chest pain, shortness of breath, mood changes.   Objective  Blood pressure 124/80, pulse 84, height 5\' 8"  (1.727 m), weight (!) 310 lb (140.6 kg), SpO2 97 %. Systems examined below as of 10/20/17   General: No apparent distress alert and oriented x3 mood and affect normal, dressed  appropriately.  HEENT: Pupils equal, extraocular movements intact  Respiratory: Patient's speak in full sentences and does not appear short of breath  Cardiovascular: No lower extremity edema, non tender, no erythema  Skin: Warm dry intact with no signs of infection or rash on extremities or on axial skeleton.  Abdomen: Soft nontender  Neuro: Cranial nerves II through XII are intact, neurovascularly intact in all extremities with 2+ DTRs and 2+ pulses.  Lymph: No lymphadenopathy of posterior or anterior cervical chain or axillae bilaterally.  Gait severely antalgic MSK:  Non tender with full range of motion and good stability and symmetric strength and tone of shoulders, elbows, wrist, hip, and  ankles bilaterally.  Patient's knee exam on the left side shows a trace amount of swelling.  Patient has flexion to 95 degrees but does have significant discomfort.  Lateral tracking of the kneecap noted.  Mild crepitus noted.  Positive patella grind noted.  Stability of the knee noted.  Contralateral knee mild arthritic changes.  Limited musculoskeletal ultrasound was performed and interpreted Judi Saa  Limited musculoskeletal ultrasound shows that patient does have trace effusion as well as some calcific loose bodies noted of the lateral patellofemoral joint.  Seems to be in the elbow extra-articular.  Narrowing of the medial joint line is previously seen.  No acute fracture of the patella though noted. Impression: Small joint effusion with possible loose bodies and moderate to severe arthritic changes of the patellofemoral joint     Impression and Recommendations:     This case required medical decision making of moderate complexity.      Note: This dictation was prepared with Dragon dictation along with smaller phrase technology. Any transcriptional errors that result from this process are unintentional.

## 2017-10-20 ENCOUNTER — Ambulatory Visit: Payer: Self-pay

## 2017-10-20 ENCOUNTER — Encounter: Payer: Self-pay | Admitting: Family Medicine

## 2017-10-20 ENCOUNTER — Ambulatory Visit: Payer: Managed Care, Other (non HMO) | Admitting: Family Medicine

## 2017-10-20 ENCOUNTER — Ambulatory Visit (INDEPENDENT_AMBULATORY_CARE_PROVIDER_SITE_OTHER)
Admission: RE | Admit: 2017-10-20 | Discharge: 2017-10-20 | Disposition: A | Discharge status: Home / Self Care | Payer: Managed Care, Other (non HMO) | Source: Ambulatory Visit | Attending: Family Medicine | Admitting: Family Medicine

## 2017-10-20 VITALS — BP 124/80 | HR 84 | Ht 68.0 in | Wt 310.0 lb

## 2017-10-20 DIAGNOSIS — M1712 Unilateral primary osteoarthritis, left knee: Secondary | ICD-10-CM | POA: Diagnosis not present

## 2017-10-20 DIAGNOSIS — M25562 Pain in left knee: Secondary | ICD-10-CM | POA: Diagnosis not present

## 2017-10-20 NOTE — Patient Instructions (Signed)
Good to see you  Wear the brace you are in daily for next week  The immobilizer at night through Sunday  Ice 20 minutes 2 times daily. Usually after activity and before bed. Nothing more then daily activity  See me again in 2 weeks

## 2017-10-21 NOTE — Assessment & Plan Note (Signed)
I believe the patient did have a flare.  I am concerned the patient also had subluxation of the patella.  Went to the emergency room and had x-rays at that time that are not available to me.  X-rays ordered today to further evaluate for any loose bodies that could be contributing.  Discussed with patient that knee immobilizer would be the most beneficial the patient appears to have significant difficulty walking secondary to it.  Discussed icing regimen, patient put in a hinged brace that I think will be better.  Topical anti-inflammatories.  Patient denied any pain medications.  Was able to ambulate at the aid of a crutch.  Patient will follow-up in 1 to 2 weeks

## 2017-11-02 NOTE — Progress Notes (Signed)
[name] [address] [place], [place] [address] Phone: [phone] Subjective:     CC: Knee pain  BJY:NWGNFAOZHY  Katherine Conway is a 47 y.o. female coming in with complaint of knee pain. States that the knee is doing much better.  Patient has osteoarthritic changes moderate to severe.  Was found to have a subluxation of the kneecap.  States though that doing much better.     Past Medical History:  Diagnosis Date  . Allergy    Past Surgical History:  Procedure Laterality Date  . CESAREAN SECTION     Social History   Socioeconomic History  . Marital status: Divorced    Spouse name: Not on file  . Number of children: Not on file  . Years of education: Not on file  . Highest education level: Not on file  Occupational History  . Not on file  Social Needs  . Financial resource strain: Not on file  . Food insecurity:    Worry: Not on file    Inability: Not on file  . Transportation needs:    Medical: Not on file    Non-medical: Not on file  Tobacco Use  . Smoking status: Never Smoker  . Smokeless tobacco: Never Used  Substance and Sexual Activity  . Alcohol use: Yes    Comment: occasional wine   . Drug use: No  . Sexual activity: Not Currently  Lifestyle  . Physical activity:    Days per week: Not on file    Minutes per session: Not on file  . Stress: Not on file  Relationships  . Social connections:    Talks on phone: Not on file    Gets together: Not on file    Attends religious service: Not on file    Active member of club or organization: Not on file    Attends meetings of clubs or organizations: Not on file    Relationship status: Not on file  Other Topics Concern  . Not on file  Social History Narrative  . Not on file   No Known Allergies Family History  Problem Relation Age of Onset  . Hypertension Mother   . Hypertension Father   . Breast cancer Maternal Aunt   . Hypertension Maternal Grandmother   .  Hypertension Maternal Grandfather   . Hypertension Paternal Grandmother   . Heart disease Paternal Grandfather   . Hypertension Paternal Grandfather      Past medical history, social, surgical and family history all reviewed in electronic medical record.  No pertanent information unless stated regarding to the chief complaint.   Review of Systems:Review of systems updated and as accurate as of 11/03/17  No headache, visual changes, nausea, vomiting, diarrhea, constipation, dizziness, abdominal pain, skin rash, fevers, chills, night sweats, weight loss, swollen lymph nodes, body aches, joint swelling, muscle aches, chest pain, shortness of breath, mood changes.   Objective  Blood pressure 110/84, pulse 68, height 5\' 8"  (1.727 m), weight (!) 308 lb (139.7 kg), SpO2 97 %. Systems examined below as of 11/03/17   General: No apparent distress alert and oriented x3 mood and affect normal, dressed appropriately.  HEENT: Pupils equal, extraocular movements intact  Respiratory: Patient's speak in full sentences and does not appear short of breath  Cardiovascular: No lower extremity edema, non tender, no erythema  Skin: Warm dry intact with no signs of infection or rash on extremities or on axial skeleton.  Abdomen: Soft nontender  Neuro:  Cranial nerves II through XII are intact, neurovascularly intact in all extremities with 2+ DTRs and 2+ pulses.  Lymph: No lymphadenopathy of posterior or anterior cervical chain or axillae bilaterally.  Gait antalgic MSK:  Non tender with full range of motion and good stability and symmetric strength and tone of shoulders, elbows, wrist, hip, and ankles bilaterally.  Knee: Left valgus deformity noted. Large thigh to calf ratio.  Tender to palpation over medial and PF joint line.  ROM full in flexion and extension and lower leg rotation. instability with valgus force.  painful patellar compression. Patellar glide with moderate crepitus. Patellar and  quadriceps tendons unremarkable. Hamstring and quadriceps strength is normal. Contralateral knee shows mild arthritic changes as well.    Impression and Recommendations:     This case required medical decision making of moderate complexity.      Note: This dictation was prepared with Dragon dictation along with smaller phrase technology. Any transcriptional errors that result from this process are unintentional.

## 2017-11-03 ENCOUNTER — Encounter: Payer: Self-pay | Admitting: Family Medicine

## 2017-11-03 ENCOUNTER — Ambulatory Visit: Payer: Managed Care, Other (non HMO) | Admitting: Family Medicine

## 2017-11-03 DIAGNOSIS — M1712 Unilateral primary osteoarthritis, left knee: Secondary | ICD-10-CM | POA: Diagnosis not present

## 2017-11-03 NOTE — Patient Instructions (Signed)
Great to see you  Katherine Conway is your friend COntinue the brace with a lot of activity for another 2 weeks then only with working out.  Keep doing everything else As long as you do well see me again in 3 months!

## 2017-11-03 NOTE — Assessment & Plan Note (Signed)
Better at this time after the subluxation.  I do not feel that there is any loose body that is contributing at this time.  Patient given home exercises.  Patient declined formal physical therapy.  Patient will follow-up with me again in 6 to 8 weeks

## 2018-01-09 ENCOUNTER — Encounter: Payer: Self-pay | Admitting: Internal Medicine

## 2018-01-09 DIAGNOSIS — L309 Dermatitis, unspecified: Secondary | ICD-10-CM | POA: Insufficient documentation

## 2018-01-23 NOTE — Progress Notes (Deleted)
Katherine Conway [address] [place], [place] [address] Phone: [phone] Subjective:    I'm seeing this patient by the request  of:    CC:   [id number]  Katherine Conway is a 47 y.o. female coming in with complaint of ***  Onset-  Location Duration-  Character- Aggravating factors- Reliving factors-  Therapies tried-  Severity-     Past Medical History:  Diagnosis Date  . Allergy    Past Surgical History:  Procedure Laterality Date  . CESAREAN SECTION     Social History   Socioeconomic History  . Marital status: Divorced    Spouse name: Not on file  . Number of children: Not on file  . Years of education: Not on file  . Highest education level: Not on file  Occupational History  . Not on file  Social Needs  . Financial resource strain: Not on file  . Food insecurity:    Worry: Not on file    Inability: Not on file  . Transportation needs:    Medical: Not on file    Non-medical: Not on file  Tobacco Use  . Smoking status: Never Smoker  . Smokeless tobacco: Never Used  Substance and Sexual Activity  . Alcohol use: Yes    Comment: occasional wine   . Drug use: No  . Sexual activity: Not Currently  Lifestyle  . Physical activity:    Days per week: Not on file    Minutes per session: Not on file  . Stress: Not on file  Relationships  . Social connections:    Talks on phone: Not on file    Gets together: Not on file    Attends religious service: Not on file    Active member of club or organization: Not on file    Attends meetings of clubs or organizations: Not on file    Relationship status: Not on file  Other Topics Concern  . Not on file  Social History Narrative  . Not on file   No Known Allergies Family History  Problem Relation Age of Onset  . Hypertension Mother   . Hypertension Father   . Breast cancer Maternal Aunt   . Hypertension Maternal Grandmother   . Hypertension Maternal Grandfather   .  Hypertension Paternal Grandmother   . Heart disease Paternal Grandfather   . Hypertension Paternal Grandfather      Current Outpatient Medications (Cardiovascular):  Marland Kitchen  EPINEPHrine (EPIPEN 2-PAK) 0.3 mg/0.3 mL IJ SOAJ injection, EpiPen 2-Pak 0.3 mg/0.3 mL injection, auto-injector  Current Outpatient Medications (Respiratory):  Marland Kitchen  Carbinoxamine Maleate (RYVENT) 6 MG TABS, Take by mouth. .  Loratadine (CLARITIN PO), Take by mouth.  Current Outpatient Medications (Analgesics):  .  naproxen sodium (ANAPROX) 220 MG tablet, Take 220 mg by mouth 2 (two) times daily with a meal.   Current Outpatient Medications (Other):  Marland Kitchen  Diclofenac Sodium 2 % SOLN, Place 2 g onto the skin 2 (two) times daily. .  hydrOXYzine (ATARAX/VISTARIL) 10 MG tablet, hydroxyzine HCl 10 mg tablet    Past medical history, social, surgical and family history all reviewed in electronic medical record.  No pertanent information unless stated regarding to the chief complaint.   Review of Systems:  No headache, visual changes, nausea, vomiting, diarrhea, constipation, dizziness, abdominal pain, skin rash, fevers, chills, night sweats, weight loss, swollen lymph nodes, body aches, joint swelling, muscle aches, chest pain, shortness of breath, mood changes.   Objective  There were no vitals taken for this visit. Systems examined below as of    General: No apparent distress alert and oriented x3 mood and affect normal, dressed appropriately.  HEENT: Pupils equal, extraocular movements intact  Respiratory: Patient's speak in full sentences and does not appear short of breath  Cardiovascular: No lower extremity edema, non tender, no erythema  Skin: Warm dry intact with no signs of infection or rash on extremities or on axial skeleton.  Abdomen: Soft nontender  Neuro: Cranial nerves II through XII are intact, neurovascularly intact in all extremities with 2+ DTRs and 2+ pulses.  Lymph: No lymphadenopathy of posterior or  anterior cervical chain or axillae bilaterally.  Gait normal with good balance and coordination.  MSK:  Non tender with full range of motion and good stability and symmetric strength and tone of shoulders, elbows, wrist, hip, knee and ankles bilaterally.     Impression and Recommendations:     This case required medical decision making of moderate complexity. The above documentation has been reviewed and is accurate and complete Judi Saa, DO       Note: This dictation was prepared with Dragon dictation along with smaller phrase technology. Any transcriptional errors that result from this process are unintentional.

## 2018-01-25 ENCOUNTER — Ambulatory Visit: Payer: Managed Care, Other (non HMO) | Admitting: Family Medicine

## 2018-01-25 NOTE — Progress Notes (Signed)
[name] [address] [place], [place] [address] Phone: [phone] Subjective:   Bruce Donath, am serving as a scribe for Dr. Antoine Primas.  I'm seeing this patient by the request  of:    CC: Knee pain follow-up  [id number]  Katherine Conway is a 47 y.o. female coming in with complaint of knee pain. Was traveling last week and did not bring brace. She does not some swelling. Stair climbing is still bothersome without brace. Does wear brace to the gym.  Overall patient is feeling much better.  Feels like she is 80% better.  Nothing that is severe.  Patient was coming back to see if she needs any type of injection but feels like at this moment the knees do not stop her from any activity.  Some mild popping and cracking       Past Medical History:  Diagnosis Date  . Allergy    Past Surgical History:  Procedure Laterality Date  . CESAREAN SECTION     Social History   Socioeconomic History  . Marital status: Divorced    Spouse name: Not on file  . Number of children: Not on file  . Years of education: Not on file  . Highest education level: Not on file  Occupational History  . Not on file  Social Needs  . Financial resource strain: Not on file  . Food insecurity:    Worry: Not on file    Inability: Not on file  . Transportation needs:    Medical: Not on file    Non-medical: Not on file  Tobacco Use  . Smoking status: Never Smoker  . Smokeless tobacco: Never Used  Substance and Sexual Activity  . Alcohol use: Yes    Comment: occasional wine   . Drug use: No  . Sexual activity: Not Currently  Lifestyle  . Physical activity:    Days per week: Not on file    Minutes per session: Not on file  . Stress: Not on file  Relationships  . Social connections:    Talks on phone: Not on file    Gets together: Not on file    Attends religious service: Not on file    Active member of club or organization: Not on file    Attends  meetings of clubs or organizations: Not on file    Relationship status: Not on file  Other Topics Concern  . Not on file  Social History Narrative  . Not on file   No Known Allergies Family History  Problem Relation Age of Onset  . Hypertension Mother   . Hypertension Father   . Breast cancer Maternal Aunt   . Hypertension Maternal Grandmother   . Hypertension Maternal Grandfather   . Hypertension Paternal Grandmother   . Heart disease Paternal Grandfather   . Hypertension Paternal Grandfather      Current Outpatient Medications (Cardiovascular):  Marland Kitchen  EPINEPHrine (EPIPEN 2-PAK) 0.3 mg/0.3 mL IJ SOAJ injection, EpiPen 2-Pak 0.3 mg/0.3 mL injection, auto-injector  Current Outpatient Medications (Respiratory):  Marland Kitchen  Carbinoxamine Maleate (RYVENT) 6 MG TABS, Take by mouth. .  Loratadine (CLARITIN PO), Take by mouth.  Current Outpatient Medications (Analgesics):  .  naproxen sodium (ANAPROX) 220 MG tablet, Take 220 mg by mouth 2 (two) times daily with a meal.   Current Outpatient Medications (Other):  Marland Kitchen  Diclofenac Sodium 2 % SOLN, Place 2 g onto the skin 2 (two) times daily. .  hydrOXYzine (  ATARAX/VISTARIL) 10 MG tablet, hydroxyzine HCl 10 mg tablet    Past medical history, social, surgical and family history all reviewed in electronic medical record.  No pertanent information unless stated regarding to the chief complaint.   Review of Systems:  No headache, visual changes, nausea, vomiting, diarrhea, constipation, dizziness, abdominal pain, skin rash, fevers, chills, night sweats, weight loss, swollen lymph nodes, body aches, joint swelling, muscle aches, chest pain, shortness of breath, mood changes.   Objective  Blood pressure 118/82, pulse 72, height 5\' 8"  (1.727 m), weight (!) 306 lb (138.8 kg), SpO2 96 %.   General: No apparent distress alert and oriented x3 mood and affect normal, dressed appropriately.  HEENT: Pupils equal, extraocular movements intact  Respiratory:  Patient's speak in full sentences and does not appear short of breath  Cardiovascular: No lower extremity edema, non tender, no erythema  Skin: Warm dry intact with no signs of infection or rash on extremities or on axial skeleton.  Abdomen: Soft nontender morbidly obese Neuro: Cranial nerves II through XII are intact, neurovascularly intact in all extremities with 2+ DTRs and 2+ pulses.  Lymph: No lymphadenopathy of posterior or anterior cervical chain or axillae bilaterally.  Gait normal with good balance and coordination.  MSK:  Non tender with full range of motion and good stability and symmetric strength and tone of shoulders, elbows, wrist, hip, and ankles bilaterally.  Knee: Bilateral valgus deformity noted. Large thigh to calf ratio.  Tender to palpation over medial and PF joint line.  ROM full in flexion and extension and lower leg rotation. instability with valgus force.  painful patellar compression. Patellar glide with severe crepitus. Patellar and quadriceps tendons unremarkable. Hamstring and quadriceps strength is normal.     Impression and Recommendations:     This case required medical decision making of moderate complexity. The above documentation has been reviewed and is accurate and complete Judi Saa, DO       Note: This dictation was prepared with Dragon dictation along with smaller phrase technology. Any transcriptional errors that result from this process are unintentional.

## 2018-01-26 ENCOUNTER — Ambulatory Visit: Payer: Managed Care, Other (non HMO) | Admitting: Family Medicine

## 2018-01-26 DIAGNOSIS — M1712 Unilateral primary osteoarthritis, left knee: Secondary | ICD-10-CM | POA: Diagnosis not present

## 2018-01-26 NOTE — Patient Instructions (Signed)
Good to see you  Katherine Conway is your friend  Stay active Keep it up  See me again in 6-8 weeks if not perfect

## 2018-01-26 NOTE — Assessment & Plan Note (Signed)
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  Patient though has been doing well with the bracing.  We discussed icing regimen discussed icing regimen and home exercises.  Discussed which activities to do which wants to avoid.  Discussed posture and ergonomics.  Follow-up again in 4 to 8 weeks

## 2018-01-27 ENCOUNTER — Encounter: Payer: Self-pay | Admitting: Internal Medicine

## 2018-01-27 ENCOUNTER — Ambulatory Visit (INDEPENDENT_AMBULATORY_CARE_PROVIDER_SITE_OTHER): Payer: Managed Care, Other (non HMO) | Admitting: Internal Medicine

## 2018-01-27 VITALS — BP 122/84 | HR 80 | Temp 98.6°F | Ht 68.0 in | Wt 303.8 lb

## 2018-01-27 DIAGNOSIS — Z0000 Encounter for general adult medical examination without abnormal findings: Secondary | ICD-10-CM

## 2018-01-27 DIAGNOSIS — Z23 Encounter for immunization: Secondary | ICD-10-CM | POA: Diagnosis not present

## 2018-01-27 LAB — POCT URINALYSIS DIPSTICK
Blood, UA: NEGATIVE
GLUCOSE UA: NEGATIVE
LEUKOCYTES UA: NEGATIVE
Nitrite, UA: NEGATIVE
PH UA: 5.5 (ref 5.0–8.0)
Protein, UA: NEGATIVE
Spec Grav, UA: >=1.03 — AB (ref 1.010–1.025)
Urobilinogen, UA: 1 E.U./dL

## 2018-01-27 NOTE — Addendum Note (Signed)
Addended by: Mariam Dollar on: 01/27/2018 04:37 PM   Modules accepted: Orders

## 2018-01-27 NOTE — Progress Notes (Signed)
Subjective:     Patient ID: Katherine Conway , female    DOB: 01-04-1971 , 47 y.o.   MRN: [id number]    Patient's last menstrual period was 03/31/2013 (approximate)..  Negative for: breast discharge, breast lump(s), breast pain and breast self exam. Associated symptoms include abnormal vaginal bleeding. Pertinent negatives include abnormal bleeding (hematology), anxiety, decreased libido, depression, difficulty falling sleep, dyspareunia, history of infertility, nocturia, sexual dysfunction, sleep disturbances, urinary incontinence, urinary urgency, vaginal discharge and vaginal itching. Diet regular.The patient states her exercise level is    . The patient's tobacco use is:  Social History   Tobacco Use  Smoking Status Never Smoker  Smokeless Tobacco Never Used  . She has been exposed to passive smoke. The patient's alcohol use is:  Social History   Substance and Sexual Activity  Alcohol Use Yes   Comment: occasional wine   . Additional information: She is followed by Dr. Estanislado Pandy for her gyn exams. She was seen this summer.       She is here today for a full physical examination. She is followed by Dr. Estanislado Pandy for her gyn care. She has no specific concerns or complaints at this time.     Past Medical History:  Diagnosis Date  . Allergy       Current Outpatient Medications:  .  Diclofenac Sodium 2 % SOLN, Place 2 g onto the skin 2 (two) times daily., Disp: 112 g, Rfl: 3 .  EPINEPHrine (EPIPEN 2-PAK) 0.3 mg/0.3 mL IJ SOAJ injection, EpiPen 2-Pak 0.3 mg/0.3 mL injection, auto-injector, Disp: , Rfl:  .  hydrOXYzine (ATARAX/VISTARIL) 10 MG tablet, hydroxyzine HCl 10 mg tablet, Disp: , Rfl:  .  Loratadine (CLARITIN PO), Take by mouth., Disp: , Rfl:    No Known Allergies   Review of Systems  Constitutional: Negative.   HENT: Negative.   Eyes: Negative.   Respiratory: Negative.   Cardiovascular: Negative.   Gastrointestinal: Negative.   Endocrine: Negative.   Genitourinary:  Negative.   Musculoskeletal: Negative.   Skin: Negative.   Allergic/Immunologic: Negative.   Neurological: Negative.   Psychiatric/Behavioral: Negative.      Today's Vitals   01/27/18 1430  BP: 122/84  Pulse: 80  Temp: 98.6 F (37 C)  TempSrc: Oral  Weight: (!) 303 lb 12.8 oz (137.8 kg)  Height: 5\' 8"  (1.727 m)   Body mass index is 46.19 kg/m.   Objective:  Physical Exam  Constitutional: She is oriented to person, place, and time. She appears well-developed and well-nourished.  obese  HENT:  Head: Normocephalic and atraumatic.  Right Ear: External ear normal.  Left Ear: External ear normal.  Nose: Nose normal.  Mouth/Throat: Oropharynx is clear and moist.  Eyes: Pupils are equal, round, and reactive to light. Conjunctivae and EOM are normal.  Neck: Normal range of motion. Neck supple.  Cardiovascular: Normal rate, regular rhythm, normal heart sounds and intact distal pulses.  Pulmonary/Chest: Effort normal and breath sounds normal.  Abdominal: Soft. Bowel sounds are normal. She exhibits no distension and no mass. There is no tenderness. There is no rebound and no guarding.  obese  Genitourinary:  Genitourinary Comments: deferred  Musculoskeletal: Normal range of motion.  Neurological: She is alert and oriented to person, place, and time.  Skin: Skin is warm and dry.  Psychiatric: She has a normal mood and affect.  Nursing note and vitals reviewed.       Assessment And Plan:     1. Routine general medical examination at  health care facility  A full exam was performed. Importance of monthly self breast exams was discussed with the patient.    PATIENT HAS BEEN ADVISED TO GET 30-45 MINUTES REGULAR EXERCISE NO LESS THAN FOUR TO FIVE DAYS PER WEEK - BOTH WEIGHTBEARING EXERCISES AND AEROBIC ARE RECOMMENDED.   SHE IS ADVISED TO FOLLOW A HEALTHY DIET WITH AT LEAST SIX FRUITS/VEGGIES PER DAY, DECREASE INTAKE OF RED MEAT, AND TO INCREASE FISH INTAKE TO TWO DAYS PER WEEK.   MEATS/FISH SHOULD NOT BE FRIED, BAKED OR BROILED IS PREFERABLE.  I SUGGEST WEARING SPF 50 SUNSCREEN ON EXPOSED PARTS AND ESPECIALLY WHEN IN THE DIRECT SUNLIGHT FOR AN EXTENDED PERIOD OF TIME.  PLEASE AVOID FAST FOOD RESTAURANTS AND INCREASE YOUR WATER INTAKE.  2. Need for vaccination  - Flu Vaccine QUAD 6+ mos PF IM (Fluarix Quad PF)       Gwynneth Aliment, MD

## 2018-01-28 LAB — CMP14+EGFR
A/G RATIO: 1.8 (ref 1.2–2.2)
ALT: 24 IU/L (ref 0–32)
AST: 18 IU/L (ref 0–40)
Albumin: 4.8 g/dL (ref 3.5–5.5)
Alkaline Phosphatase: 79 IU/L (ref 39–117)
BILIRUBIN TOTAL: 0.6 mg/dL (ref 0.0–1.2)
BUN/Creatinine Ratio: 23 (ref 9–23)
BUN: 22 mg/dL (ref 6–24)
CHLORIDE: 106 mmol/L (ref 96–106)
CO2: 22 mmol/L (ref 20–29)
Calcium: 9.6 mg/dL (ref 8.7–10.2)
Creatinine, Ser: 0.97 mg/dL (ref 0.57–1.00)
GFR calc Af Amer: 80 mL/min/{1.73_m2} (ref >59)
GFR calc non Af Amer: 70 mL/min/{1.73_m2} (ref >59)
GLUCOSE: 88 mg/dL (ref 65–99)
Globulin, Total: 2.6 g/dL (ref 1.5–4.5)
POTASSIUM: 4.6 mmol/L (ref 3.5–5.2)
Sodium: 143 mmol/L (ref 134–144)
TOTAL PROTEIN: 7.4 g/dL (ref 6.0–8.5)

## 2018-01-28 LAB — CBC
Hematocrit: 41.7 % (ref 34.0–46.6)
Hemoglobin: 14.1 g/dL (ref 11.1–15.9)
MCH: 30.8 pg (ref 26.6–33.0)
MCHC: 33.8 g/dL (ref 31.5–35.7)
MCV: 91 fL (ref 79–97)
Platelets: 288 10*3/uL (ref 150–450)
RBC: 4.58 x10E6/uL (ref 3.77–5.28)
RDW: 12.7 % (ref 12.3–15.4)
WBC: 8.7 10*3/uL (ref 3.4–10.8)

## 2018-01-28 LAB — HEMOGLOBIN A1C
ESTIMATED AVERAGE GLUCOSE: 108 mg/dL
HEMOGLOBIN A1C: 5.4 % (ref 4.8–5.6)

## 2018-01-28 LAB — LIPID PANEL
CHOLESTEROL TOTAL: 192 mg/dL (ref 100–199)
Chol/HDL Ratio: 3.7 ratio (ref 0.0–4.4)
HDL: 52 mg/dL (ref >39)
LDL Calculated: 119 mg/dL — ABNORMAL HIGH (ref 0–99)
TRIGLYCERIDES: 105 mg/dL (ref 0–149)
VLDL Cholesterol Cal: 21 mg/dL (ref 5–40)

## 2018-01-28 NOTE — Progress Notes (Signed)
Here are your lab results:  Your urine is concentrated - be sure to drink plenty of water daily.  Your blood count is normal. Your liver and kidney function are normal. Your hba1c is 5.4, you are NOT prediabetic.   Your LDL, bad cholesterol, is 119. Ideally, this should be less than 100. You are almost there! You have done a great job in modifying your lifestyle! Keep up your efforts!  Please let me know if you have any questions.   Sincerely,    Caytlin Better N. Allyne Gee, MD

## 2018-03-11 ENCOUNTER — Ambulatory Visit: Payer: Managed Care, Other (non HMO) | Admitting: Family Medicine

## 2018-07-29 ENCOUNTER — Ambulatory Visit: Payer: Managed Care, Other (non HMO) | Admitting: Internal Medicine

## 2018-09-13 ENCOUNTER — Ambulatory Visit: Payer: Managed Care, Other (non HMO) | Admitting: Internal Medicine

## 2019-02-01 ENCOUNTER — Encounter: Payer: Managed Care, Other (non HMO) | Admitting: Internal Medicine

## 2019-02-17 ENCOUNTER — Encounter: Payer: Self-pay | Admitting: Internal Medicine

## 2019-02-17 ENCOUNTER — Ambulatory Visit (INDEPENDENT_AMBULATORY_CARE_PROVIDER_SITE_OTHER): Payer: Managed Care, Other (non HMO) | Admitting: Internal Medicine

## 2019-02-17 ENCOUNTER — Other Ambulatory Visit: Payer: Self-pay

## 2019-02-17 VITALS — BP 108/74 | HR 82 | Temp 98.5°F | Ht 68.0 in | Wt 312.0 lb

## 2019-02-17 DIAGNOSIS — Z0000 Encounter for general adult medical examination without abnormal findings: Secondary | ICD-10-CM | POA: Diagnosis not present

## 2019-02-17 DIAGNOSIS — Z23 Encounter for immunization: Secondary | ICD-10-CM

## 2019-02-17 DIAGNOSIS — Z6841 Body Mass Index (BMI) 40.0 and over, adult: Secondary | ICD-10-CM

## 2019-02-17 DIAGNOSIS — R635 Abnormal weight gain: Secondary | ICD-10-CM

## 2019-02-17 LAB — POCT URINALYSIS DIPSTICK
Blood, UA: NEGATIVE
Glucose, UA: NEGATIVE
Ketones, UA: NEGATIVE
Leukocytes, UA: NEGATIVE
Nitrite, UA: NEGATIVE
Protein, UA: NEGATIVE
Spec Grav, UA: >=1.03 — AB (ref 1.010–1.025)
Urobilinogen, UA: 1 E.U./dL
pH, UA: 5.5 (ref 5.0–8.0)

## 2019-02-17 NOTE — Patient Instructions (Signed)
Health Maintenance, Female Adopting a healthy lifestyle and getting preventive care are important in promoting health and wellness. Ask your health care provider about:  The right schedule for you to have regular tests and exams.  Things you can do on your own to prevent diseases and keep yourself healthy. What should I know about diet, weight, and exercise? Eat a healthy diet   Eat a diet that includes plenty of vegetables, fruits, low-fat dairy products, and lean protein.  Do not eat a lot of foods that are high in solid fats, added sugars, or sodium. Maintain a healthy weight Body mass index (BMI) is used to identify weight problems. It estimates body fat based on height and weight. Your health care provider can help determine your BMI and help you achieve or maintain a healthy weight. Get regular exercise Get regular exercise. This is one of the most important things you can do for your health. Most adults should:  Exercise for at least 150 minutes each week. The exercise should increase your heart rate and make you sweat (moderate-intensity exercise).  Do strengthening exercises at least twice a week. This is in addition to the moderate-intensity exercise.  Spend less time sitting. Even light physical activity can be beneficial. Watch cholesterol and blood lipids Have your blood tested for lipids and cholesterol at 48 years of age, then have this test every 5 years. Have your cholesterol levels checked more often if:  Your lipid or cholesterol levels are high.  You are older than 48 years of age.  You are at high risk for heart disease. What should I know about cancer screening? Depending on your health history and family history, you may need to have cancer screening at various ages. This may include screening for:  Breast cancer.  Cervical cancer.  Colorectal cancer.  Skin cancer.  Lung cancer. What should I know about heart disease, diabetes, and high blood  pressure? Blood pressure and heart disease  High blood pressure causes heart disease and increases the risk of stroke. This is more likely to develop in people who have high blood pressure readings, are of African descent, or are overweight.  Have your blood pressure checked: ? Every 3-5 years if you are 18-39 years of age. ? Every year if you are 40 years old or older. Diabetes Have regular diabetes screenings. This checks your fasting blood sugar level. Have the screening done:  Once every three years after age 40 if you are at a normal weight and have a low risk for diabetes.  More often and at a younger age if you are overweight or have a high risk for diabetes. What should I know about preventing infection? Hepatitis B If you have a higher risk for hepatitis B, you should be screened for this virus. Talk with your health care provider to find out if you are at risk for hepatitis B infection. Hepatitis C Testing is recommended for:  Everyone born from 1945 through 1965.  Anyone with known risk factors for hepatitis C. Sexually transmitted infections (STIs)  Get screened for STIs, including gonorrhea and chlamydia, if: ? You are sexually active and are younger than 48 years of age. ? You are older than 48 years of age and your health care provider tells you that you are at risk for this type of infection. ? Your sexual activity has changed since you were last screened, and you are at increased risk for chlamydia or gonorrhea. Ask your health care provider if   you are at risk.  Ask your health care provider about whether you are at high risk for HIV. Your health care provider may recommend a prescription medicine to help prevent HIV infection. If you choose to take medicine to prevent HIV, you should first get tested for HIV. You should then be tested every 3 months for as long as you are taking the medicine. Pregnancy  If you are about to stop having your period (premenopausal) and  you may become pregnant, seek counseling before you get pregnant.  Take 400 to 800 micrograms (mcg) of folic acid every day if you become pregnant.  Ask for birth control (contraception) if you want to prevent pregnancy. Osteoporosis and menopause Osteoporosis is a disease in which the bones lose minerals and strength with aging. This can result in bone fractures. If you are 65 years old or older, or if you are at risk for osteoporosis and fractures, ask your health care provider if you should:  Be screened for bone loss.  Take a calcium or vitamin D supplement to lower your risk of fractures.  Be given hormone replacement therapy (HRT) to treat symptoms of menopause. Follow these instructions at home: Lifestyle  Do not use any products that contain nicotine or tobacco, such as cigarettes, e-cigarettes, and chewing tobacco. If you need help quitting, ask your health care provider.  Do not use street drugs.  Do not share needles.  Ask your health care provider for help if you need support or information about quitting drugs. Alcohol use  Do not drink alcohol if: ? Your health care provider tells you not to drink. ? You are pregnant, may be pregnant, or are planning to become pregnant.  If you drink alcohol: ? Limit how much you use to 0-1 drink a day. ? Limit intake if you are breastfeeding.  Be aware of how much alcohol is in your drink. In the [place] one drink equals one 12 oz bottle of beer (355 mL), one 5 oz glass of wine (148 mL), or one 1 oz glass of hard liquor (44 mL). General instructions  Schedule regular health, dental, and eye exams.  Stay current with your vaccines.  Tell your health care provider if: ? You often feel depressed. ? You have ever been abused or do not feel safe at home. Summary  Adopting a healthy lifestyle and getting preventive care are important in promoting health and wellness.  Follow your health care provider's instructions about healthy  diet, exercising, and getting tested or screened for diseases.  Follow your health care provider's instructions on monitoring your cholesterol and blood pressure. This information is not intended to replace advice given to you by your health care provider. Make sure you discuss any questions you have with your health care provider. Document Released: 10/07/2010 Document Revised: 03/17/2018 Document Reviewed: 03/17/2018 Elsevier Patient Education  2020 [employer]  

## 2019-02-17 NOTE — Progress Notes (Signed)
Subjective:     Patient ID: Katherine Conway , female    DOB: 30-Apr-1970 , 48 y.o.   MRN: [id number]   Chief Complaint  Patient presents with  . Annual Exam    HPI  She is here today for a full physical examination.  She is followed by Dr. Cletis Media for her GYN care. She was seen earlier this year for her GYN exam.     Past Medical History:  Diagnosis Date  . Allergy      Family History  Problem Relation Age of Onset  . Hypertension Mother   . Hypertension Father   . Breast cancer Maternal Aunt   . Hypertension Maternal Grandmother   . Hypertension Maternal Grandfather   . Hypertension Paternal Grandmother   . Heart disease Paternal Grandfather   . Hypertension Paternal Grandfather      Current Outpatient Medications:  .  Loratadine (CLARITIN PO), Take by mouth., Disp: , Rfl:    No Known Allergies   Review of Systems  Constitutional: Negative.   HENT: Negative.   Eyes: Negative.   Respiratory: Negative.   Cardiovascular: Negative.   Endocrine: Negative.   Genitourinary: Negative.   Musculoskeletal: Negative.   Skin: Negative.   Allergic/Immunologic: Negative.   Neurological: Negative.   Hematological: Negative.   Psychiatric/Behavioral: Negative.      Today's Vitals   02/17/19 1106  BP: 108/74  Pulse: 82  Temp: 98.5 F (36.9 C)  TempSrc: Oral  Weight: (!) 312 lb (141.5 kg)  Height: '5\' 8"'  (1.727 m)   Body mass index is 47.44 kg/m.   Objective:  Physical Exam Vitals signs and nursing note reviewed.  Constitutional:      Appearance: Normal appearance. She is obese.  HENT:     Head: Normocephalic and atraumatic.     Right Ear: Tympanic membrane, ear canal and external ear normal.     Left Ear: Tympanic membrane, ear canal and external ear normal.     Nose: Nose normal.     Mouth/Throat:     Mouth: Mucous membranes are moist.     Pharynx: Oropharynx is clear.  Eyes:     Extraocular Movements: Extraocular movements intact.      Conjunctiva/sclera: Conjunctivae normal.     Pupils: Pupils are equal, round, and reactive to light.  Neck:     Musculoskeletal: Normal range of motion and neck supple.  Cardiovascular:     Rate and Rhythm: Normal rate and regular rhythm.     Pulses: Normal pulses.     Heart sounds: Normal heart sounds.  Pulmonary:     Effort: Pulmonary effort is normal.     Breath sounds: Normal breath sounds.  Chest:     Breasts: Tanner Score is 5.        Right: Normal.        Left: Normal.  Abdominal:     General: Bowel sounds are normal.     Palpations: Abdomen is soft.     Comments: Obese.   Genitourinary:    Comments: deferred Musculoskeletal: Normal range of motion.  Skin:    General: Skin is warm and dry.  Neurological:     General: No focal deficit present.     Mental Status: She is alert and oriented to person, place, and time.  Psychiatric:        Mood and Affect: Mood normal.        Behavior: Behavior normal.         Assessment And  Plan:   1. Routine general medical examination at health care facility  A full exam was performed.  Importance of monthly self breast exams was discussed with the patient.  PATIENT HAS BEEN ADVISED TO GET 30-45 MINUTES REGULAR EXERCISE NO LESS THAN FOUR TO FIVE DAYS PER WEEK - BOTH WEIGHTBEARING EXERCISES AND AEROBIC ARE RECOMMENDED.  SHE WAS ADVISED TO FOLLOW A HEALTHY DIET WITH AT LEAST SIX FRUITS/VEGGIES PER DAY, DECREASE INTAKE OF RED MEAT, AND TO INCREASE FISH INTAKE TO TWO DAYS PER WEEK.  MEATS/FISH SHOULD NOT BE FRIED, BAKED OR BROILED IS PREFERABLE.  I SUGGEST WEARING SPF 50 SUNSCREEN ON EXPOSED PARTS AND ESPECIALLY WHEN IN THE DIRECT SUNLIGHT FOR AN EXTENDED PERIOD OF TIME.  PLEASE AVOID FAST FOOD RESTAURANTS AND INCREASE YOUR WATER INTAKE.  - CMP14+EGFR - CBC - Lipid panel - Hemoglobin A1c - TSH  2. Need for vaccination  - Tdap vaccine greater than or equal to 7yo IM - Flu Vaccine QUAD 6+ mos PF IM (Fluarix Quad PF)  3. Weight  gain  She was made aware of her 6 pound weight gain since her last visit in 2019. She is encouraged to incorporate more exercise into her daily routine. She is advised to strive for 30 minutes five days per week. She wants to wait six months before coming back in for re-evaluation.   4. Class 3 severe obesity due to excess calories without serious comorbidity with body mass index (BMI) of 45.0 to 49.9 in adult [facility])  Importance of achieving optimal weight to decrease risk of cardiovascular disease and cancers was discussed with the patient in full detail. She is encouraged to start slowly - start with 10 minutes twice daily at least three to four days per week and to gradually build to 30 minutes five days weekly. She was given tips to incorporate more activity into her daily routine - take stairs when possible, park farther away from her job, grocery stores, etc.     Maximino Greenland, MD    THE PATIENT IS ENCOURAGED TO PRACTICE SOCIAL DISTANCING DUE TO THE COVID-19 PANDEMIC.

## 2019-02-17 NOTE — Addendum Note (Signed)
Addended by: Michelle Nasuti on: 02/17/2019 04:48 PM   Modules accepted: Orders

## 2019-02-18 LAB — CBC
Hematocrit: 41.7 % (ref 34.0–46.6)
Hemoglobin: 14.4 g/dL (ref 11.1–15.9)
MCH: 31.3 pg (ref 26.6–33.0)
MCHC: 34.5 g/dL (ref 31.5–35.7)
MCV: 91 fL (ref 79–97)
Platelets: 293 10*3/uL (ref 150–450)
RBC: 4.6 x10E6/uL (ref 3.77–5.28)
RDW: 12.6 % (ref 11.7–15.4)
WBC: 6.6 10*3/uL (ref 3.4–10.8)

## 2019-02-18 LAB — LIPID PANEL
Chol/HDL Ratio: 3.4 ratio (ref 0.0–4.4)
Cholesterol, Total: 182 mg/dL (ref 100–199)
HDL: 54 mg/dL (ref >39)
LDL Chol Calc (NIH): 116 mg/dL — ABNORMAL HIGH (ref 0–99)
Triglycerides: 65 mg/dL (ref 0–149)
VLDL Cholesterol Cal: 12 mg/dL (ref 5–40)

## 2019-02-18 LAB — HEMOGLOBIN A1C
Est. average glucose Bld gHb Est-mCnc: 105 mg/dL
Hgb A1c MFr Bld: 5.3 % (ref 4.8–5.6)

## 2019-02-18 LAB — CMP14+EGFR
ALT: 17 IU/L (ref 0–32)
AST: 18 IU/L (ref 0–40)
Albumin/Globulin Ratio: 1.9 (ref 1.2–2.2)
Albumin: 4.7 g/dL (ref 3.8–4.8)
Alkaline Phosphatase: 81 IU/L (ref 39–117)
BUN/Creatinine Ratio: 20 (ref 9–23)
BUN: 17 mg/dL (ref 6–24)
Bilirubin Total: 0.7 mg/dL (ref 0.0–1.2)
CO2: 22 mmol/L (ref 20–29)
Calcium: 9.5 mg/dL (ref 8.7–10.2)
Chloride: 106 mmol/L (ref 96–106)
Creatinine, Ser: 0.85 mg/dL (ref 0.57–1.00)
GFR calc Af Amer: 94 mL/min/{1.73_m2} (ref >59)
GFR calc non Af Amer: 81 mL/min/{1.73_m2} (ref >59)
Globulin, Total: 2.5 g/dL (ref 1.5–4.5)
Glucose: 96 mg/dL (ref 65–99)
Potassium: 4.6 mmol/L (ref 3.5–5.2)
Sodium: 140 mmol/L (ref 134–144)
Total Protein: 7.2 g/dL (ref 6.0–8.5)

## 2019-02-18 LAB — TSH: TSH: 2.02 u[IU]/mL (ref 0.450–4.500)

## 2019-04-21 ENCOUNTER — Other Ambulatory Visit: Payer: Self-pay

## 2019-04-21 ENCOUNTER — Encounter: Payer: Self-pay | Admitting: Family Medicine

## 2019-04-21 ENCOUNTER — Ambulatory Visit (INDEPENDENT_AMBULATORY_CARE_PROVIDER_SITE_OTHER): Payer: Managed Care, Other (non HMO)

## 2019-04-21 ENCOUNTER — Ambulatory Visit: Payer: Managed Care, Other (non HMO) | Admitting: Family Medicine

## 2019-04-21 VITALS — BP 108/78 | HR 86 | Ht 68.0 in | Wt 317.0 lb

## 2019-04-21 DIAGNOSIS — S86819A Strain of other muscle(s) and tendon(s) at lower leg level, unspecified leg, initial encounter: Secondary | ICD-10-CM | POA: Insufficient documentation

## 2019-04-21 DIAGNOSIS — M25571 Pain in right ankle and joints of right foot: Secondary | ICD-10-CM

## 2019-04-21 DIAGNOSIS — G8929 Other chronic pain: Secondary | ICD-10-CM | POA: Diagnosis not present

## 2019-04-21 DIAGNOSIS — S86811A Strain of other muscle(s) and tendon(s) at lower leg level, right leg, initial encounter: Secondary | ICD-10-CM | POA: Diagnosis not present

## 2019-04-21 DIAGNOSIS — M19071 Primary osteoarthritis, right ankle and foot: Secondary | ICD-10-CM | POA: Diagnosis not present

## 2019-04-21 NOTE — Assessment & Plan Note (Signed)
Crests calf strain noted.  Patient was having pain.  Achilles appears to be completely intact.  Negative Thompson test.  Discussed compression sleeve.  Patient will follow up with me again in 4 weeks

## 2019-04-21 NOTE — Patient Instructions (Addendum)
Calf strain Heel lift 1/8 inch  Ex 3x a week Wear good shoes Ice 20 min 2x a day See me in 4 weeks if not completely resolved

## 2019-04-21 NOTE — Progress Notes (Signed)
[name] [address] [place] [address] Phone: [phone] Subjective:   Bruce Donath, am serving as a scribe for Dr. Antoine Primas. This visit occurred during the SARS-CoV-2 public health emergency.  Safety protocols were in place, including screening questions prior to the visit, additional usage of staff PPE, and extensive cleaning of exam room while observing appropriate contact time as indicated for disinfecting solutions.    CC: Right ankle pain  [id number]  Katherine Conway is a 49 y.o. female coming in with complaint of right ankle pain for 2 weeks after slipping down the stairs. Denies any pop or snap during fall. Pain in achilles. Pain was improving but she "stepped wrong" and pain began to increase. Has been wearing brace and using compression sleeve.  Rates the severity of pain is 5 out of 10      Past Medical History:  Diagnosis Date  . Allergy    Past Surgical History:  Procedure Laterality Date  . CESAREAN SECTION     Social History   Socioeconomic History  . Marital status: Divorced    Spouse name: Not on file  . Number of children: Not on file  . Years of education: Not on file  . Highest education level: Not on file  Occupational History  . Not on file  Tobacco Use  . Smoking status: Never Smoker  . Smokeless tobacco: Never Used  Substance and Sexual Activity  . Alcohol use: Yes    Comment: occasional wine   . Drug use: No  . Sexual activity: Not Currently  Other Topics Concern  . Not on file  Social History Narrative  . Not on file   Social Determinants of Health   Financial Resource Strain:   . Difficulty of Paying Living Expenses: Not on file  Food Insecurity:   . Worried About Programme researcher, broadcasting/film/video in the Last Year: Not on file  . Ran Out of Food in the Last Year: Not on file  Transportation Needs:   . Lack of Transportation (Medical): Not on file  . Lack of Transportation (Non-Medical): Not  on file  Physical Activity:   . Days of Exercise per Week: Not on file  . Minutes of Exercise per Session: Not on file  Stress:   . Feeling of Stress : Not on file  Social Connections:   . Frequency of Communication with Friends and Family: Not on file  . Frequency of Social Gatherings with Friends and Family: Not on file  . Attends Religious Services: Not on file  . Active Member of Clubs or Organizations: Not on file  . Attends Banker Meetings: Not on file  . Marital Status: Not on file   No Known Allergies Family History  Problem Relation Age of Onset  . Hypertension Mother   . Hypertension Father   . Breast cancer Maternal Aunt   . Hypertension Maternal Grandmother   . Hypertension Maternal Grandfather   . Hypertension Paternal Grandmother   . Heart disease Paternal Grandfather   . Hypertension Paternal Grandfather       Current Outpatient Medications (Respiratory):  Marland Kitchen  Loratadine (CLARITIN PO), Take by mouth.       Past medical history, social, surgical and family history all reviewed in electronic medical record.  No pertanent information unless stated regarding to the chief complaint.   Review of Systems:  No headache, visual changes, nausea, vomiting, diarrhea, constipation, dizziness, abdominal pain, skin rash, fevers,  chills, night sweats, weight loss, swollen lymph nodes, body aches, joint swelling, muscle aches, chest pain, shortness of breath, mood changes.   Objective  Blood pressure 108/78, pulse 86, height 5\' 8"  (1.727 m), weight (!) 317 lb (143.8 kg), last menstrual period 03/31/2013, SpO2 98 %.    General: No apparent distress alert and oriented x3 mood and affect normal, dressed appropriately.  Morbidly obese HEENT: Pupils equal, extraocular movements intact  Respiratory: Patient's speak in full sentences and does not appear short of breath  Cardiovascular: 1+ lower extremity edema, non tender, no erythema  Skin: Warm dry intact with  no signs of infection or rash on extremities or on axial skeleton.  Abdomen: Soft nontender  Neuro: Cranial nerves II through XII are intact, neurovascularly intact in all extremities with 2+ DTRs and 2+ pulses.  Lymph: No lymphadenopathy of posterior or anterior cervical chain or axillae bilaterally.  Gait antalgic.  MSK:  Non tender with full range of motion and good stability and symmetric strength and tone of shoulders, elbows, wrist, hip, knee bilaterally.  Right ankle exam does have what appears to be lateral column overload with patient having more of a supinated foot.  Patient's Achilles is intact.  Negative Thompson.  Mild tender more in the Achilles gastroc insertion area.  Good strength but does have some mild pain with resisted dorsiflexion.  Limited musculoskeletal ultrasound was performed and interpreted by Lyndal Pulley  Limited ultrasound of patient's foot and ankle only shows that patient does have maybe a mild strain of the medial gastroc head near the insertion of the Achilles but no true tear appreciated.  Patient's Achilles is intact at its insertion.  No cortical irregularity of the ankle except for the anterior lateral aspect does show moderate impingement and arthritis  [id number]; 15 additional minutes spent for Therapeutic exercises as stated in above notes.  This included exercises focusing on stretching, strengthening, with significant focus on eccentric aspects.   Long term goals include an improvement in range of motion, strength, endurance as well as avoiding reinjury. Patient's frequency would include in 1-2 times a day, 3-5 times a week for a duration of 6-12 weeks. Ankle strengthening that included:  Basic range of motion exercises to allow proper full motion at ankle Stretching of the lower leg and hamstrings  Theraband exercises for the lower leg - inversion, eversion, dorsiflexion and plantarflexion each to be completed with a theraband Balance exercises to increase  proprioception Weight bearing exercises to increase strength and balance   Proper technique shown and discussed handout in great detail with ATC.  All questions were discussed and answered.     Impression and Recommendations:     This case required medical decision making of moderate complexity. The above documentation has been reviewed and is accurate and complete Lyndal Pulley, DO       Note: This dictation was prepared with Dragon dictation along with smaller phrase technology. Any transcriptional errors that result from this process are unintentional.

## 2019-05-16 ENCOUNTER — Ambulatory Visit: Payer: Managed Care, Other (non HMO) | Admitting: Family Medicine

## 2019-07-21 ENCOUNTER — Telehealth: Payer: Self-pay

## 2019-07-21 NOTE — Telephone Encounter (Signed)
CALLED PT TO [facility] 05/13 APPT NO ANS LVM TO CALL [facility]

## 2019-08-18 ENCOUNTER — Ambulatory Visit: Payer: Managed Care, Other (non HMO) | Admitting: Internal Medicine

## 2019-08-25 ENCOUNTER — Encounter: Payer: Self-pay | Admitting: Internal Medicine

## 2019-08-25 ENCOUNTER — Ambulatory Visit: Payer: 59 | Admitting: Internal Medicine

## 2019-08-25 ENCOUNTER — Other Ambulatory Visit: Payer: Self-pay

## 2019-08-25 VITALS — BP 136/78 | HR 71 | Temp 98.2°F | Ht 68.0 in | Wt 318.0 lb

## 2019-08-25 DIAGNOSIS — R030 Elevated blood-pressure reading, without diagnosis of hypertension: Secondary | ICD-10-CM

## 2019-08-25 DIAGNOSIS — Z6841 Body Mass Index (BMI) 40.0 and over, adult: Secondary | ICD-10-CM

## 2019-08-25 DIAGNOSIS — R400 Somnolence: Secondary | ICD-10-CM | POA: Diagnosis not present

## 2019-08-25 NOTE — Patient Instructions (Addendum)
Calm, magnesium supplement 1 tsp nightly   DASH Eating Plan DASH stands for "Dietary Approaches to Stop Hypertension." The DASH eating plan is a healthy eating plan that has been shown to reduce high blood pressure (hypertension). It may also reduce your risk for type 2 diabetes, heart disease, and stroke. The DASH eating plan may also help with weight loss. What are tips for following this plan?  General guidelines  Avoid eating more than 2,300 mg (milligrams) of salt (sodium) a day. If you have hypertension, you may need to reduce your sodium intake to 1,500 mg a day.  Limit alcohol intake to no more than 1 drink a day for nonpregnant women and 2 drinks a day for men. One drink equals 12 oz of beer, 5 oz of wine, or 1 oz of hard liquor.  Work with your health care provider to maintain a healthy body weight or to lose weight. Ask what an ideal weight is for you.  Get at least 30 minutes of exercise that causes your heart to beat faster (aerobic exercise) most days of the week. Activities may include walking, swimming, or biking.  Work with your health care provider or diet and nutrition specialist (dietitian) to adjust your eating plan to your individual calorie needs. Reading food labels   Check food labels for the amount of sodium per serving. Choose foods with less than 5 percent of the Daily Value of sodium. Generally, foods with less than 300 mg of sodium per serving fit into this eating plan.  To find whole grains, look for the word "whole" as the first word in the ingredient list. Shopping  Buy products labeled as "low-sodium" or "no salt added."  Buy fresh foods. Avoid canned foods and premade or frozen meals. Cooking  Avoid adding salt when cooking. Use salt-free seasonings or herbs instead of table salt or sea salt. Check with your health care provider or pharmacist before using salt substitutes.  Do not fry foods. Cook foods using healthy methods such as baking, boiling,  grilling, and broiling instead.  Cook with heart-healthy oils, such as olive, canola, soybean, or sunflower oil. Meal planning  Eat a balanced diet that includes: ? 5 or more servings of fruits and vegetables each day. At each meal, try to fill half of your plate with fruits and vegetables. ? Up to 6-8 servings of whole grains each day. ? Less than 6 oz of lean meat, poultry, or fish each day. A 3-oz serving of meat is about the same size as a deck of cards. One egg equals 1 oz. ? 2 servings of low-fat dairy each day. ? A serving of nuts, seeds, or beans 5 times each week. ? Heart-healthy fats. Healthy fats called Omega-3 fatty acids are found in foods such as flaxseeds and coldwater fish, like sardines, salmon, and mackerel.  Limit how much you eat of the following: ? Canned or prepackaged foods. ? Food that is high in trans fat, such as fried foods. ? Food that is high in saturated fat, such as fatty meat. ? Sweets, desserts, sugary drinks, and other foods with added sugar. ? Full-fat dairy products.  Do not salt foods before eating.  Try to eat at least 2 vegetarian meals each week.  Eat more home-cooked food and less restaurant, buffet, and fast food.  When eating at a restaurant, ask that your food be prepared with less salt or no salt, if possible. What foods are recommended? The items listed may not be  a complete list. Talk with your dietitian about what dietary choices are best for you. Grains Whole-grain or whole-wheat bread. Whole-grain or whole-wheat pasta. Brown rice. Modena Morrow. Bulgur. Whole-grain and low-sodium cereals. Pita bread. Low-fat, low-sodium crackers. Whole-wheat flour tortillas. Vegetables Fresh or frozen vegetables (raw, steamed, roasted, or grilled). Low-sodium or reduced-sodium tomato and vegetable juice. Low-sodium or reduced-sodium tomato sauce and tomato paste. Low-sodium or reduced-sodium canned vegetables. Fruits All fresh, dried, or frozen  fruit. Canned fruit in natural juice (without added sugar). Meat and other protein foods Skinless chicken or [place]. Ground chicken or [place]. Pork with fat trimmed off. Fish and seafood. Egg whites. Dried beans, peas, or lentils. Unsalted nuts, nut butters, and seeds. Unsalted canned beans. Lean cuts of beef with fat trimmed off. Low-sodium, lean deli meat. Dairy Low-fat (1%) or fat-free (skim) milk. Fat-free, low-fat, or reduced-fat cheeses. Nonfat, low-sodium ricotta or cottage cheese. Low-fat or nonfat yogurt. Low-fat, low-sodium cheese. Fats and oils Soft margarine without trans fats. Vegetable oil. Low-fat, reduced-fat, or light mayonnaise and salad dressings (reduced-sodium). Canola, safflower, olive, soybean, and sunflower oils. Avocado. Seasoning and other foods Herbs. Spices. Seasoning mixes without salt. Unsalted popcorn and pretzels. Fat-free sweets. What foods are not recommended? The items listed may not be a complete list. Talk with your dietitian about what dietary choices are best for you. Grains Baked goods made with fat, such as croissants, muffins, or some breads. Dry pasta or rice meal packs. Vegetables Creamed or fried vegetables. Vegetables in a cheese sauce. Regular canned vegetables (not low-sodium or reduced-sodium). Regular canned tomato sauce and paste (not low-sodium or reduced-sodium). Regular tomato and vegetable juice (not low-sodium or reduced-sodium). Angie Fava. Olives. Fruits Canned fruit in a light or heavy syrup. Fried fruit. Fruit in cream or butter sauce. Meat and other protein foods Fatty cuts of meat. Ribs. Fried meat. Berniece Salines. Sausage. Bologna and other processed lunch meats. Salami. Fatback. Hotdogs. Bratwurst. Salted nuts and seeds. Canned beans with added salt. Canned or smoked fish. Whole eggs or egg yolks. Chicken or [place] with skin. Dairy Whole or 2% milk, cream, and half-and-half. Whole or full-fat cream cheese. Whole-fat or sweetened yogurt. Full-fat  cheese. Nondairy creamers. Whipped toppings. Processed cheese and cheese spreads. Fats and oils Butter. Stick margarine. Lard. Shortening. Ghee. Bacon fat. Tropical oils, such as coconut, palm kernel, or palm oil. Seasoning and other foods Salted popcorn and pretzels. Onion salt, garlic salt, seasoned salt, table salt, and sea salt. Worcestershire sauce. Tartar sauce. Barbecue sauce. Teriyaki sauce. Soy sauce, including reduced-sodium. Steak sauce. Canned and packaged gravies. Fish sauce. Oyster sauce. Cocktail sauce. Horseradish that you find on the shelf. Ketchup. Mustard. Meat flavorings and tenderizers. Bouillon cubes. Hot sauce and Tabasco sauce. Premade or packaged marinades. Premade or packaged taco seasonings. Relishes. Regular salad dressings. Where to find more information:  National Heart, Lung, and [place]: https://wilson-eaton.com/  American Heart Association: www.heart.org Summary  The DASH eating plan is a healthy eating plan that has been shown to reduce high blood pressure (hypertension). It may also reduce your risk for type 2 diabetes, heart disease, and stroke.  With the DASH eating plan, you should limit salt (sodium) intake to 2,300 mg a day. If you have hypertension, you may need to reduce your sodium intake to 1,500 mg a day.  When on the DASH eating plan, aim to eat more fresh fruits and vegetables, whole grains, lean proteins, low-fat dairy, and heart-healthy fats.  Work with your health care provider or diet and nutrition specialist (  dietitian) to adjust your eating plan to your individual calorie needs. This information is not intended to replace advice given to you by your health care provider. Make sure you discuss any questions you have with your health care provider. Document Revised: 03/06/2017 Document Reviewed: 03/17/2016 Elsevier Patient Education  2020 [employer].

## 2019-08-27 NOTE — Progress Notes (Signed)
This visit occurred during the SARS-CoV-2 public health emergency.  Safety protocols were in place, including screening questions prior to the visit, additional usage of staff PPE, and extensive cleaning of exam room while observing appropriate contact time as indicated for disinfecting solutions.  Subjective:     Patient ID: Katherine Conway , female    DOB: May 06, 1970 , 49 y.o.   MRN: [id number]   Chief Complaint  Patient presents with  . Weight Check    HPI  She is here today for weight check. She is not taking any meds at this time. She reports that she has recently started going back to the gym.     Past Medical History:  Diagnosis Date  . Allergy      Family History  Problem Relation Age of Onset  . Hypertension Mother   . Hypertension Father   . Breast cancer Maternal Aunt   . Hypertension Maternal Grandmother   . Hypertension Maternal Grandfather   . Hypertension Paternal Grandmother   . Heart disease Paternal Grandfather   . Hypertension Paternal Grandfather      Current Outpatient Medications:  .  Loratadine (CLARITIN PO), Take by mouth., Disp: , Rfl:    No Known Allergies   Review of Systems  Constitutional: Positive for fatigue.  Respiratory: Negative.   Cardiovascular: Negative.   Gastrointestinal: Negative.   Neurological: Negative.   Psychiatric/Behavioral: Negative.      Today's Vitals   08/25/19 1603  BP: 136/78  Pulse: 71  Temp: 98.2 F (36.8 C)  TempSrc: Oral  Weight: (!) 318 lb (144.2 kg)  Height: 5\' 8"  (1.727 m)   Body mass index is 48.35 kg/m.   Objective:  Physical Exam Vitals and nursing note reviewed.  Constitutional:      Appearance: Normal appearance.  HENT:     Head: Normocephalic and atraumatic.  Cardiovascular:     Rate and Rhythm: Normal rate and regular rhythm.     Heart sounds: Normal heart sounds.  Pulmonary:     Effort: Pulmonary effort is normal.     Breath sounds: Normal breath sounds.  Skin:    General:  Skin is warm.  Neurological:     General: No focal deficit present.     Mental Status: She is alert.  Psychiatric:        Mood and Affect: Mood normal.        Behavior: Behavior normal.         Assessment And Plan:     1. Elevated blood pressure reading  Pt was made aware of today's BP reading. She was advised that optimal BP is less than 120/80. She is encouraged to avoid adding salt to her foods and to follow a low-sodium diet. She was given information on DASH diet as well. She will rto in 3 months for re-evaluation.   2. Class 3 severe obesity due to excess calories without serious comorbidity with body mass index (BMI) of 45.0 to 49.9 in adult (HCC)  BMI 48. She is encouraged to initially strive for BMi less than 40 to decrease cardiac risk. Advised to aim for 30 minutes exercise five days per week. Will discuss pharmacologic intervention at her next visit.   3. Daytime somnolence  Due to elevated blood pressure, I asked her other questions regarding sleep quality. She is at risk for OSA, she agrees to Neuro referral for sleep study evaluation. I also discussed the risks associated with untreated OSA including hypertension, heart failure and stroke.   -  Ambulatory referral to Neurology        Gwynneth Aliment, MD    THE PATIENT IS ENCOURAGED TO PRACTICE SOCIAL DISTANCING DUE TO THE COVID-19 PANDEMIC.

## 2019-09-13 ENCOUNTER — Ambulatory Visit (INDEPENDENT_AMBULATORY_CARE_PROVIDER_SITE_OTHER): Payer: 59 | Admitting: Neurology

## 2019-09-13 ENCOUNTER — Encounter: Payer: Self-pay | Admitting: Neurology

## 2019-09-13 VITALS — BP 134/88 | HR 65 | Ht 68.0 in | Wt 310.0 lb

## 2019-09-13 DIAGNOSIS — G478 Other sleep disorders: Secondary | ICD-10-CM

## 2019-09-13 DIAGNOSIS — R0683 Snoring: Secondary | ICD-10-CM

## 2019-09-13 DIAGNOSIS — Z6841 Body Mass Index (BMI) 40.0 and over, adult: Secondary | ICD-10-CM

## 2019-09-13 NOTE — Progress Notes (Signed)
SLEEP MEDICINE CLINIC    Provider:  Larey Seat, MD  Primary Care Physician:  Glendale Chard, [address] [place] [address]     Referring Provider: Glendale Chard, [place] [address] [place],  [place] [address]          Chief Complaint according to patient   Patient presents with:    . New Patient (Initial Visit)     Internal referral from Glendale Chard, MD for daytime somnolence.       HISTORY OF PRESENT ILLNESS:  Katherine Conway is a 49 y.o. year old  African American female patient and seen here upon a referral on 09/13/2019 fChief concern according to patient :  " I lack energy and I gained weight".    I have the pleasure of seeing Katherine Conway today, a right -handed Black or [place] American female with a possible sleep disorder.  She  has a past medical history of Allergy., obesity and fatigue.    Sleep relevant medical history: none. Family medical /sleep history: No other family member on CPAP with OSA, father is a loud snorer.   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 The patient currently works from home.  Pets are not present . Tobacco use: none .  ETOH use ; socially ,  Caffeine intake in form of Coffee( 2/ week) Soda(seldomly) Tea ( rare ) and no energy drinks. Regular exercise started late April, after second vaccine.     Sleep habits are as follows: The patient's dinner time is between 7-8 PM. The patient goes to bed at 11 PM and promptly goes to sleep- continues to sleep for 2-3 hours, wakes for unknown reasons- the first time at 3-4 AM.   The preferred sleep position is on either side, with the support of 1 pillow. Dreams are reportedly frequent.  6 AM is the usual rise time for the GYM. The patient wakes up with an alarm.   She reports not feeling refreshed or restored in AM, with symptoms such as  dull morning headaches and residual fatigue/ it is harder to multitask..  Naps are taken frequently," I can nap any time"  lasting from 2-40 minutes and are more refreshing than nocturnal sleep.    Review of Systems: Out of a complete 14 system review, the patient complains of only the following symptoms, and all other reviewed systems are negative.:    Snoring, weight gain- I can always nap. Perimenopause-   How likely are you to doze in the following situations: 0 = not likely, 1 = slight chance, 2 = moderate chance, 3 = high chance   Sitting and Reading? Watching Television? Sitting inactive in a public place (theater or meeting)? As a passenger in a car for an hour without a break? Lying down in the afternoon when circumstances permit? Sitting and talking to someone? Sitting quietly after lunch without alcohol? In a car, while stopped for a few minutes in traffic?   Total = 10/ 24 points   FSS endorsed at 32 / 63 points.   Social History   Socioeconomic History  . Marital status: Divorced    Spouse name: Not on file  . Number of children: Not on file  . Years of education: Not on file  . Highest education level: Not on file  Occupational History  . Occupation: [employer]   Tobacco Use  . Smoking status: Never Smoker  . Smokeless tobacco: Never Used  Substance and Sexual Activity  . Alcohol use: Yes    Comment: occasional wine   . Drug use: No  . Sexual activity: Not Currently  Other Topics Concern  . Not on file  Social History Narrative   Coffee sometimes    Right handed    Social Determinants of Health   Financial Resource Strain:   . Difficulty of Paying Living Expenses:   Food Insecurity:   . Worried About Programme researcher, broadcasting/film/video in the Last Year:   . Barista in the Last Year:   Transportation Needs:   . Freight forwarder (Medical):   Marland Kitchen Lack of Transportation (Non-Medical):   Physical Activity:   . Days of Exercise per Week:   .  Minutes of Exercise per Session:   Stress:   . Feeling of Stress :   Social Connections:   . Frequency of Communication with Friends and Family:   . Frequency of Social Gatherings with Friends and Family:   . Attends Religious Services:   . Active Member of Clubs or Organizations:   . Attends Banker Meetings:   Marland Kitchen Marital Status:     Family History  Problem Relation Age of Onset  . Hypertension Mother   . Hypertension Father   . Breast cancer Maternal Aunt   . Hypertension Maternal Grandmother   . Hypertension Maternal Grandfather   . Hypertension Paternal Grandmother   . Heart disease Paternal Grandfather   . Hypertension Paternal Grandfather     Past Medical History:  Diagnosis Date  . Allergy     Past Surgical History:  Procedure Laterality Date  . CESAREAN SECTION       Current Outpatient Medications on File Prior to Visit  Medication Sig Dispense Refill  . Loratadine (CLARITIN PO) Take by mouth.    Marland Kitchen VITAMIN D PO Take 1 Dose by mouth daily.     No current facility-administered medications on file prior to visit.    No Known Allergies  Physical exam:  Today's Vitals   09/13/19 1053  BP: 134/88  Pulse: 65  SpO2: 98%  Weight: (!) 310 lb (140.6 kg)  Height: 5\' 8"  (1.727 m)   Body mass index is 47.14 kg/m.   Wt Readings from Last 3 Encounters:  09/13/19 (!) 310 lb (140.6 kg)  08/25/19 (!) 318 lb (144.2 kg)  04/21/19 (!) 317 lb (143.8 kg)     Ht Readings from Last 3 Encounters:  09/13/19 5\' 8"  (1.727 m)  08/25/19 5\' 8"  (1.727 m)  04/21/19 5\' 8"  (1.727 m)      General: The patient is awake, alert and appears not in acute distress. The patient is well groomed. Head: Normocephalic, atraumatic.  Neck is supple. Mallampati 2,  neck circumference:16.75  inches . Nasal airflow patent.  Retrognathia is not seen.  Dental status: intact  Cardiovascular:  Regular rate and cardiac rhythm by pulse,  without distended neck veins. Respiratory:  Lungs are clear to auscultation.  Skin:  Without evidence of ankle edema, or rash. Trunk: The patient's posture is erect.   Neurologic exam : The patient is awake and alert, oriented to place and time.   Memory subjective described as intact.  Attention span & concentration ability appears normal.  Speech is fluent,  without dysarthria, dysphonia or aphasia.  Mood and affect are appropriate.  Cranial nerves: no loss of smell or taste reported - fully vaccinated.  Pupils are equal and briskly reactive to light. Funduscopic exam deferred.   Extraocular movements in vertical and horizontal planes were intact and without nystagmus. No Diplopia.Visual fields by finger perimetry are intact. Hearing was intact to soft voice and finger rubbing.  Facial sensation intact to fine touch.  Facial motor strength is symmetric and tongue and uvula move midline.  Neck ROM : rotation, tilt and flexion extension were normal for age and shoulder shrug was symmetrical.    Motor exam:  Symmetric bulk, tone and ROM.   Normal tone without cog -wheeling, symmetric grip strength .   Sensory:  Fine touch, pinprick and vibration were tested  and  normal.  Proprioception tested in the upper extremities was normal.   Coordination: Rapid alternating movements in the fingers/hands were of normal speed.  No changes in penmanship reported.  The Finger-to-nose maneuver was intact without evidence of ataxia, dysmetria or tremor.   Gait and station: Patient could rise unassisted from a seated position, walked without assistive device.  Stance is of normal width/ base.  Toe and heel walk were deferred.  Deep tendon reflexes: in the  upper and lower extremities are symmetric and intact.  Babinski response was deferred.      After spending a total time of 35  minutes face to face and additional time for physical and neurologic examination, review of laboratory studies,  personal review of imaging studies, reports and  results of other testing and review of referral information / records as far as provided in visit, I have established the following assessments:  1)  Sleep apnea risk is elevated by BMI, Neck size, and reported snoring.  2)  There is non-restorative seep, sometimes morning headaches but no nocturia. Marland Kitchen  3)  Feeling fatigued and cognitively ' foggy"   My Plan is to proceed with:  1)  HST for Screening.    I would like to thank Dorothyann Peng, MD and Dorothyann Peng, Md [address] [place],  [place] [address] for allowing me to meet with and to take care of this pleasant patient.    I plan to follow up either personally or through our NP within 2-3 month.   CC: I will share my notes with PCP.  Electronically signed by: Melvyn Novas, MD 09/13/2019 11:17 AM  [facility] Board certified by The [employer] of Sleep Medicine and Diplomate of the [employer] of Sleep Medicine. Board certified In Neurology through the ABPN, Fellow of the [employer] of Neurology. Medical Director of [facility].  ]

## 2019-09-13 NOTE — Patient Instructions (Signed)
Screening for Sleep Apnea  Sleep apnea is a condition in which breathing pauses or becomes shallow during sleep. Sleep apnea screening is a test to determine if you are at risk for sleep apnea. The test is easy and only takes a few minutes. Your health care provider may ask you to have this test in preparation for surgery or as part of a physical exam. What are the symptoms of sleep apnea? Common symptoms of sleep apnea include:  Snoring.  Restless sleep.  Daytime sleepiness.  Pauses in breathing.  Choking during sleep.  Irritability.  Forgetfulness.  Trouble thinking clearly.  Depression.  Personality changes. Most people with sleep apnea are not aware that they have it. Why should I get screened? Getting screened for sleep apnea can help:  Ensure your safety. It is important for your health care providers to know whether or not you have sleep apnea, especially if you are having surgery or have other long-term (chronic) health conditions.  Improve your health and allow you to get a better night's rest. Restful sleep can help you: ? Have more energy. ? Lose weight. ? Improve high blood pressure. ? Improve diabetes management. ? Prevent stroke. ? Prevent car accidents. How is screening done? Screening usually includes being asked a list of questions about your sleep quality. Some questions you may be asked include:  Do you snore?  Is your sleep restless?  Do you have daytime sleepiness?  Has a partner or spouse told you that you stop breathing during sleep?  Have you had trouble concentrating or memory loss? If your screening test is positive, you are at risk for the condition. Further testing may be needed to confirm a diagnosis of sleep apnea. Where to find more information You can find screening tools online or at your health care clinic. For more information about sleep apnea screening and healthy sleep, visit these websites:  Centers for Disease Control and  Prevention: www.cdc.gov/sleep/index.html  American Sleep Apnea Association: www.sleepapnea.org Contact a health care provider if:  You think that you may have sleep apnea. Summary  Sleep apnea screening can help determine if you are at risk for sleep apnea.  It is important for your health care providers to know whether or not you have sleep apnea, especially if you are having surgery or have other chronic health conditions.  You may be asked to take a screening test for sleep apnea in preparation for surgery or as part of a physical exam. This information is not intended to replace advice given to you by your health care provider. Make sure you discuss any questions you have with your health care provider. Document Revised: 01/08/2018 Document Reviewed: 07/04/2016 Elsevier Patient Education  2020 Elsevier Inc. Quality Sleep Information, Adult Quality sleep is important for your mental and physical health. It also improves your quality of life. Quality sleep means you:  Are asleep for most of the time you are in bed.  Fall asleep within 30 minutes.  Wake up no more than once a night.  Are awake for no longer than 20 minutes if you do wake up during the night. Most adults need 7-8 hours of quality sleep each night. How can poor sleep affect me? If you do not get enough quality sleep, you may have:  Mood swings.  Daytime sleepiness.  Confusion.  Decreased reaction time.  Sleep disorders, such as insomnia and sleep apnea.  Difficulty with: ? Solving problems. ? Coping with stress. ? Paying attention. These issues may   affect your performance and productivity at work, school, and at home. Lack of sleep may also put you at higher risk for accidents, suicide, and risky behaviors. If you do not get quality sleep you may also be at higher risk for several health problems, including:  Infections.  Type 2 diabetes.  Heart disease.  High blood  pressure.  Obesity.  Worsening of long-term conditions, like arthritis, kidney disease, depression, Parkinson's disease, and epilepsy. What actions can I take to get more quality sleep?      Stick to a sleep schedule. Go to sleep and wake up at about the same time each day. Do not try to sleep less on weekdays and make up for lost sleep on weekends. This does not work.  Try to get about 30 minutes of exercise on most days. Do not exercise 2-3 hours before going to bed.  Limit naps during the day to 30 minutes or less.  Do not use any products that contain nicotine or tobacco, such as cigarettes or e-cigarettes. If you need help quitting, ask your health care provider.  Do not drink caffeinated beverages for at least 8 hours before going to bed. Coffee, tea, and some sodas contain caffeine.  Do not drink alcohol close to bedtime.  Do not eat large meals close to bedtime.  Do not take naps in the late afternoon.  Try to get at least 30 minutes of sunlight every day. Morning sunlight is best.  Make time to relax before bed. Reading, listening to music, or taking a hot bath promotes quality sleep.  Make your bedroom a place that promotes quality sleep. Keep your bedroom dark, quiet, and at a comfortable room temperature. Make sure your bed is comfortable. Take out sleep distractions like TV, a computer, smartphone, and bright lights.  If you are lying awake in bed for longer than 20 minutes, get up and do a relaxing activity until you feel sleepy.  Work with your health care provider to treat medical conditions that may affect sleeping, such as: ? Nasal obstruction. ? Snoring. ? Sleep apnea and other sleep disorders.  Talk to your health care provider if you think any of your prescription medicines may cause you to have difficulty falling or staying asleep.  If you have sleep problems, talk with a sleep consultant. If you think you have a sleep disorder, talk with your health  care provider about getting evaluated by a specialist. Where to find more information  National Sleep Foundation website: https://sleepfoundation.org  National Heart, Lung, and Blood Institute (NHLBI): www.nhlbi.nih.gov/files/docs/public/sleep/healthy_sleep.pdf  Centers for Disease Control and Prevention (CDC): www.cdc.gov/sleep/index.html Contact a health care provider if you:  Have trouble getting to sleep or staying asleep.  Often wake up very early in the morning and cannot get back to sleep.  Have daytime sleepiness.  Have daytime sleep attacks of suddenly falling asleep and sudden muscle weakness (narcolepsy).  Have a tingling sensation in your legs with a strong urge to move your legs (restless legs syndrome).  Stop breathing briefly during sleep (sleep apnea).  Think you have a sleep disorder or are taking a medicine that is affecting your quality of sleep. Summary  Most adults need 7-8 hours of quality sleep each night.  Getting enough quality sleep is an important part of health and well-being.  Make your bedroom a place that promotes quality sleep and avoid things that may cause you to have poor sleep, such as alcohol, caffeine, smoking, and large meals.  Talk to   your health care provider if you have trouble falling asleep or staying asleep. This information is not intended to replace advice given to you by your health care provider. Make sure you discuss any questions you have with your health care provider. Document Revised: 07/01/2017 Document Reviewed: 07/01/2017 Elsevier Patient Education  2020 Elsevier Inc.  

## 2019-09-15 ENCOUNTER — Telehealth: Payer: Self-pay

## 2019-09-15 NOTE — Telephone Encounter (Signed)
lvm to schedule HST 

## 2019-09-17 IMAGING — DX DG KNEE COMPLETE 4+V*L*
4 series · 4 of 4 positions shown · non-contrast
Comparison: None.

CLINICAL DATA: Chronic knee pain.

EXAM:
LEFT KNEE - COMPLETE 4+ VIEW

[knee ap]
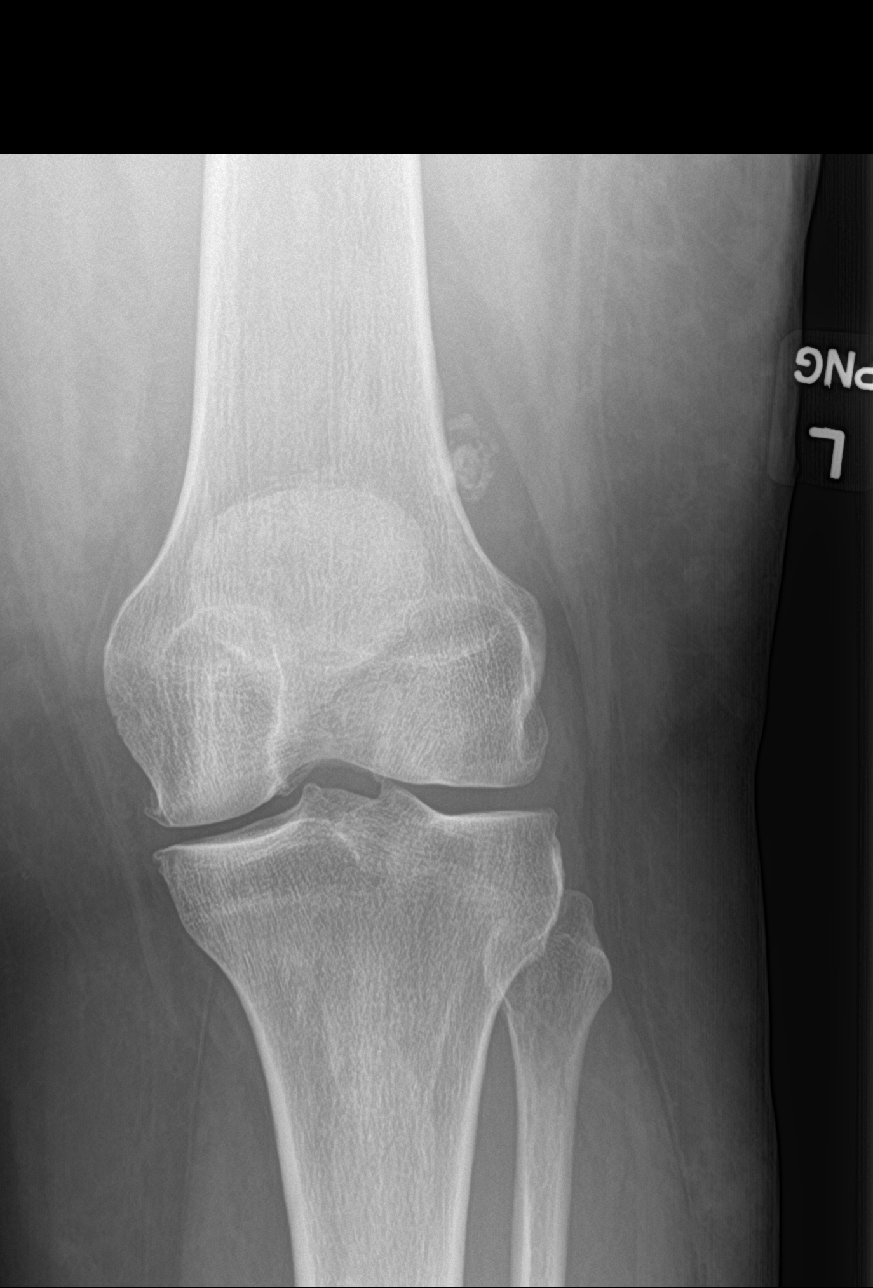

[knee tunnel]
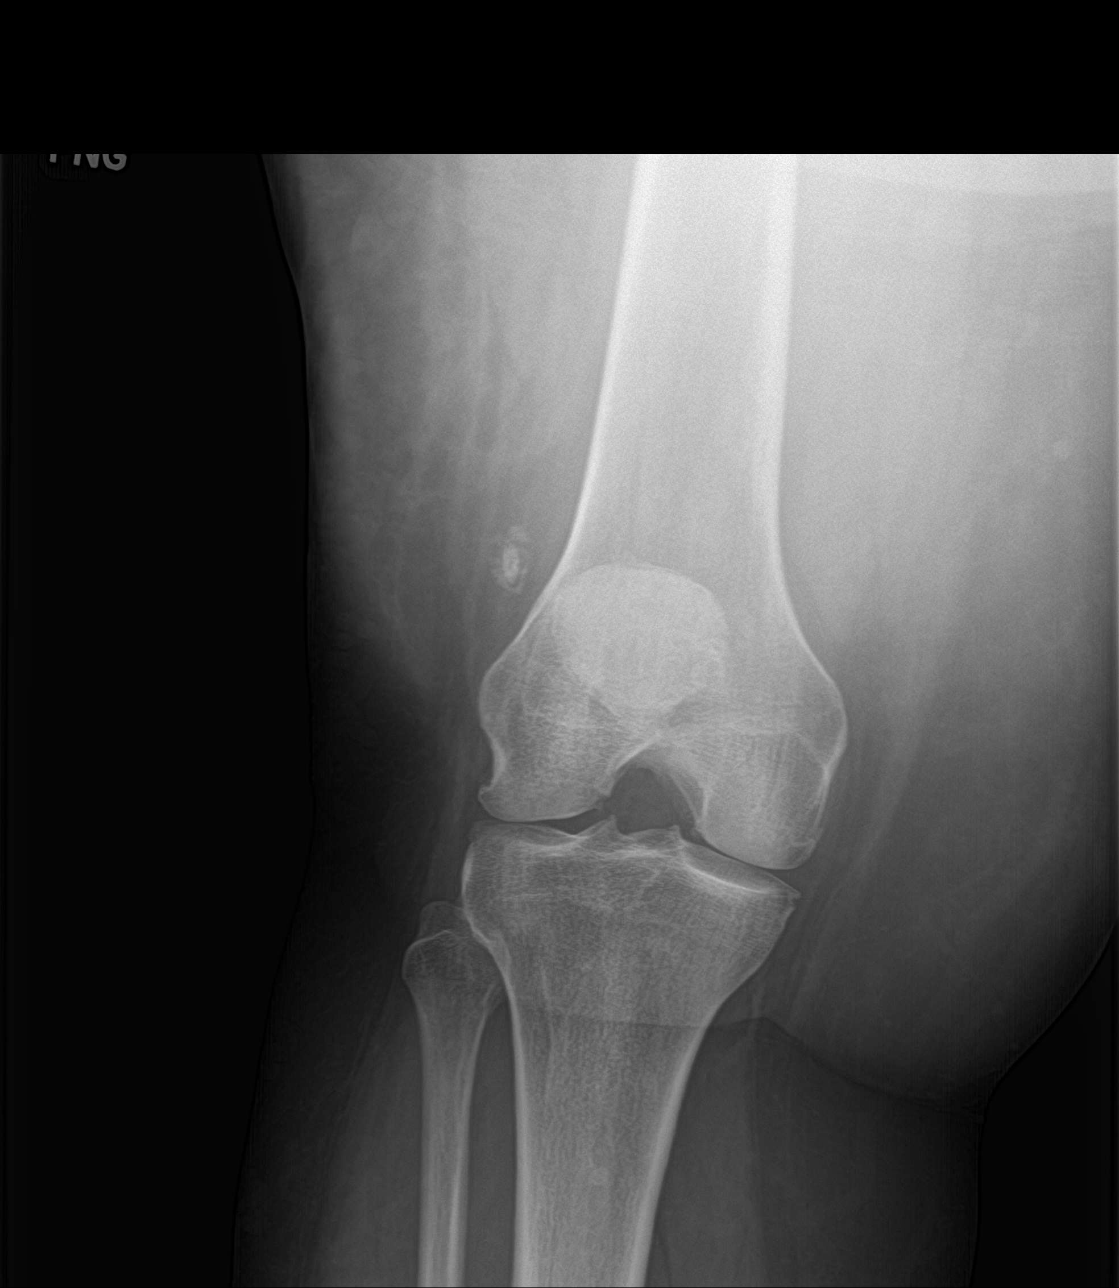

[knee lat]
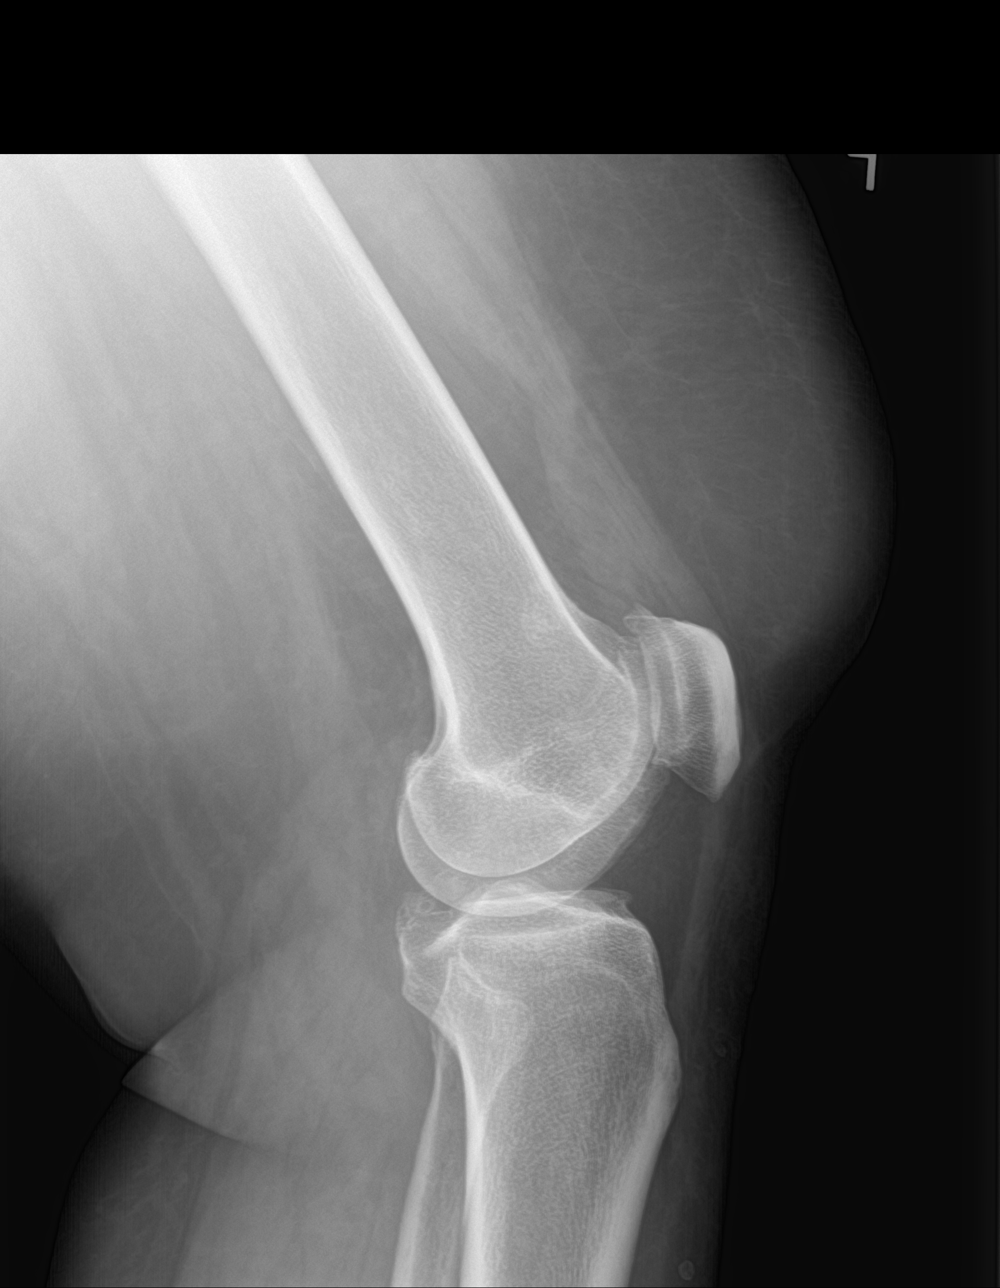

[sunrise]
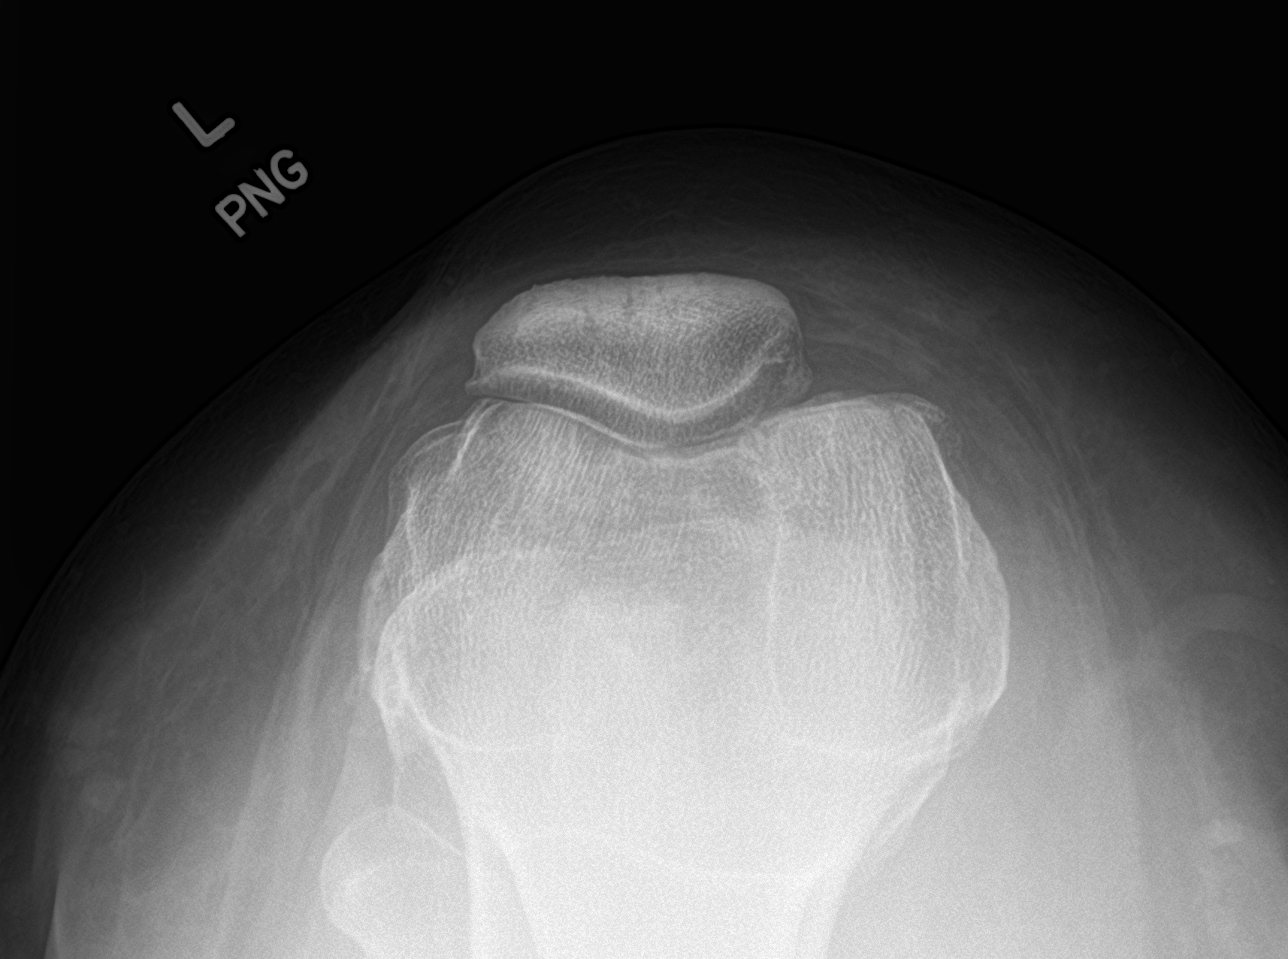

[4 of 4 positions shown; findings below may reference images not displayed]

FINDINGS: No fracture of the proximal tibia or distal femur. Patella is
normal. No joint effusion.

Mild degenerate spurring of the lateral and medial compartment and
patellofemoral compartment.

Ovoid ossific density superior medial to thelateral femoral condyle
is favored a dystrophic calcification within muscle.
IMPRESSION: Mild tricompartment osteoarthritis.

## 2019-11-14 ENCOUNTER — Ambulatory Visit (INDEPENDENT_AMBULATORY_CARE_PROVIDER_SITE_OTHER): Payer: 59 | Admitting: Neurology

## 2019-11-14 DIAGNOSIS — G4733 Obstructive sleep apnea (adult) (pediatric): Secondary | ICD-10-CM

## 2019-11-14 DIAGNOSIS — Z6841 Body Mass Index (BMI) 40.0 and over, adult: Secondary | ICD-10-CM

## 2019-11-14 DIAGNOSIS — R0683 Snoring: Secondary | ICD-10-CM

## 2019-11-14 DIAGNOSIS — G478 Other sleep disorders: Secondary | ICD-10-CM

## 2019-11-22 DIAGNOSIS — Z6841 Body Mass Index (BMI) 40.0 and over, adult: Secondary | ICD-10-CM | POA: Insufficient documentation

## 2019-11-22 DIAGNOSIS — G478 Other sleep disorders: Secondary | ICD-10-CM | POA: Insufficient documentation

## 2019-11-22 DIAGNOSIS — R0683 Snoring: Secondary | ICD-10-CM | POA: Insufficient documentation

## 2019-11-22 NOTE — Progress Notes (Signed)
Summary & Diagnosis:   Overall AHI was 24.5/h for this HST, placing the patient in the  moderate category of OSA. REM AHI was significantly higher, at  AHI of 49/h. 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  Hypoxemia for 25 minutes was also present.   Recommendations:    Non positional , REM dependent OSA will not respond to a dental  device or Inspire implant therapy- this type of apnea is treated  with PAP.  I will order an autotitration device with a setting of 6-16 cm  water, 1 cm EPR and mask of patient's choice under heated  humidification.   Interpreting Physician: Melvyn Novas, MD

## 2019-11-22 NOTE — Procedures (Signed)
  Patient Information     First Name: Katherine Last Name: Conway Cumpian: [id number]  Birth Date: [birth year] Age: 49 Gender: Female  Referring Provider: Dorothyann Peng, MD BMI: 47.1 (W=311 lbs, H=5' 8'')  Neck Circ.:  17 '' Epworth:  10   Sleep Study Information    Study Date: 11/14/19 S/H/A Version: [id number] / [id number] / 77    History:     [name] is a 49 year old African- American female patient and seen here upon a referral on 09/13/2019. Chief concern according to patient:  " I lack energy and I gained weight".    I have the pleasure of seeing [name] today, a right -handed Black or [place] American female with a possible sleep disorder.  She has a past medical history of Allergy, Morbid Obesity and fatigue.      Summary & Diagnosis:    Overall AHI was 24.5/h for this HST, placing the patient in the moderate category of OSA. REM AHI was significantly higher, at AHI of 49/h. 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 Hypoxemia for 25 minutes was also present.   Recommendations:     Non positional , REM dependent OSA will not respond to a dental device or Inspire implant therapy- this type of apnea is treated with PAP.  I will order an autotitration device with a setting of 6-16 cm water, 1 cm EPR and mask of patient's choice under heated humidification.   Interpreting Physician: Melvyn Novas, MD           Sleep Summary  Oxygen Saturation Statistics   Start Study Time: End Study Time: Total Recording Time:  10:45:00PM 7:22:59 AM 8 hrs,  Total Sleep Time % REM of Sleep Time:  7 hrs, 23 min 34.1    Mean: 93 Minimum: 76 Maximum: 100  Mean of Desaturations Nadirs (%):   88  Oxygen Desatur. %:   4-9 10-20 >20 Total  Events Number Total    88  25 77.9 22.1  0 0.0  113 100.0  Oxygen Saturation: <90 <=88 <85 <80 <70  Duration (minutes): Sleep % 25.2 5.7 18.9 3.9 4.3 0.9 0.1 0.0 0.0 0.0     Respiratory Indices      Total Events REM  NREM All Night  pRDI:  204  pAHI:  176 ODI:  113  pAHIc:  0  % CSR: 0.0 51.9 49.0 38.8 0.0 16.8 12.4 4.4 0.0 28.4 24.5 15.7 0.0       Pulse Rate Statistics during Sleep (BPM)      Mean:  60 Minimum: 47 Maximum: 88    Indices are calculated using technically valid sleep time of  7 hrs, 11 min. pRDI/pAHI are calculated using oxi desaturations ? 3%  Body Position Statistics  Position Supine Prone Right Left Non-Supine  Sleep (min) 286.5 4.0 14.0 139.5 157.5  Sleep % 64.5 0.9 3.2 31.4 35.5  pRDI 30.6 N/A 32.5 22.9 24.3  pAHI 26.1 N/A 23.2 20.8 21.6  ODI 17.8 N/A 0.0 13.0 12.0     Snoring Statistics Snoring Level (dB) >40 >50 >60 >70 >80 >Threshold (45)  Sleep (min) 286.8 11.4 4.0 0.0 0.0 53.4  Sleep % 64.6 2.6 0.9 0.0 0.0 12.0    Mean: 42 dB

## 2019-11-22 NOTE — Addendum Note (Signed)
Addended by: Melvyn Novas on: 11/22/2019 05:00 PM   Modules accepted: Orders

## 2019-11-29 ENCOUNTER — Telehealth: Payer: Self-pay | Admitting: Neurology

## 2019-11-29 NOTE — Telephone Encounter (Signed)
-----   Message from Melvyn Novas, MD sent at 11/22/2019  5:00 PM EDT ----- Summary & Diagnosis:   Overall AHI was 24.5/h for this HST, placing the patient in the  moderate category of OSA. REM AHI was significantly higher, at  AHI of 49/h. 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  Hypoxemia for 25 minutes was also present.   Recommendations:    Non positional , REM dependent OSA will not respond to a dental  device or Inspire implant therapy- this type of apnea is treated  with PAP.  I will order an autotitration device with a setting of 6-16 cm  water, 1 cm EPR and mask of patient's choice under heated  humidification.   Interpreting Physician: Melvyn Novas, MD

## 2019-11-29 NOTE — Telephone Encounter (Signed)
I called pt. I advised pt that Dr. Vickey Huger reviewed their sleep study results and found that pt has moderate-severe apnea. Dr. Vickey Huger recommends that pt starts auto CPAP. I reviewed PAP compliance expectations with the pt. Pt is agreeable to starting a CPAP. I advised pt that an order will be sent to a DME, [facility] and [facility] will call the pt within about one week after they file with the pt's insurance. [facility]) will show the pt how to use the machine, fit for masks, and troubleshoot the CPAP if needed. A follow up appt was made for insurance purposes with Butch Penny, NP on 02/16/20 at 11:30 am. Pt verbalized understanding to arrive 15 minutes early and bring their CPAP. A letter with all of this information in it will be mailed to the pt as a reminder. I verified with the pt that the address we have on file is correct. Pt verbalized understanding of results. Pt had no questions at this time but was encouraged to call back if questions arise. I have sent the order to [facility]) and have received confirmation that they have received the order.

## 2019-12-01 ENCOUNTER — Ambulatory Visit: Payer: 59 | Admitting: Internal Medicine

## 2019-12-01 ENCOUNTER — Other Ambulatory Visit: Payer: Self-pay

## 2019-12-01 ENCOUNTER — Encounter: Payer: Self-pay | Admitting: Internal Medicine

## 2019-12-01 VITALS — BP 130/80 | HR 61 | Temp 97.9°F | Ht 68.0 in | Wt 312.0 lb

## 2019-12-01 DIAGNOSIS — Z6841 Body Mass Index (BMI) 40.0 and over, adult: Secondary | ICD-10-CM | POA: Diagnosis not present

## 2019-12-01 DIAGNOSIS — R030 Elevated blood-pressure reading, without diagnosis of hypertension: Secondary | ICD-10-CM

## 2019-12-01 DIAGNOSIS — E66813 Obesity, class 3: Secondary | ICD-10-CM

## 2019-12-01 NOTE — Progress Notes (Signed)
I,Katawbba Wiggins,acting as a Neurosurgeon for Gwynneth Aliment, MD.,have documented all relevant documentation on the behalf of Gwynneth Aliment, MD,as directed by  Gwynneth Aliment, MD while in the presence of Gwynneth Aliment, MD.  This visit occurred during the SARS-CoV-2 public health emergency.  Safety protocols were in place, including screening questions prior to the visit, additional usage of staff PPE, and extensive cleaning of exam room while observing appropriate contact time as indicated for disinfecting solutions.  Subjective:     Patient ID: Katherine Conway , female    DOB: 05-12-1970 , 49 y.o.   MRN: [id number]   Chief Complaint  Patient presents with  . elevated blood pressure    HPI  The patient is here today for follow-up on elevated blood pressure.  She denies headaches, chest pain and visual changes.     Past Medical History:  Diagnosis Date  . Allergy      Family History  Problem Relation Age of Onset  . Hypertension Mother   . Hypertension Father   . Breast cancer Maternal Aunt   . Hypertension Maternal Grandmother   . Hypertension Maternal Grandfather   . Hypertension Paternal Grandmother   . Heart disease Paternal Grandfather   . Hypertension Paternal Grandfather      Current Outpatient Medications:  .  Loratadine (CLARITIN PO), Take by mouth., Disp: , Rfl:  .  VITAMIN D PO, Take 1 Dose by mouth daily., Disp: , Rfl:    No Known Allergies   Review of Systems  Constitutional: Negative.   Respiratory: Negative.   Cardiovascular: Negative.   Gastrointestinal: Negative.   Psychiatric/Behavioral: Negative.   All other systems reviewed and are negative.    Today's Vitals   12/01/19 1527  BP: 130/80  Pulse: 61  Temp: 97.9 F (36.6 C)  TempSrc: Oral  Weight: (!) 312 lb (141.5 kg)  Height: 5\' 8"  (1.727 m)   Body mass index is 47.44 kg/m.   Objective:  Physical Exam Vitals and nursing note reviewed.  Constitutional:      Appearance: Normal  appearance. She is obese.  HENT:     Head: Normocephalic and atraumatic.  Cardiovascular:     Rate and Rhythm: Normal rate and regular rhythm.     Heart sounds: Normal heart sounds.  Pulmonary:     Breath sounds: Normal breath sounds.  Skin:    General: Skin is warm.  Neurological:     General: No focal deficit present.     Mental Status: She is alert and oriented to person, place, and time.         Assessment And Plan:     1. Elevated blood pressure reading Comments: Improved. She is encouraged to follow a low-sodium diet and to aim for at least 150 minutes of exercise per week.   2. Class 3 severe obesity due to excess calories without serious comorbidity with body mass index (BMI) of 45.0 to 49.9 in adult [facility]) Comments: BMI 47. She does not wish to consider use of pharmaceuticals at this time. She plans to resume regular workouts with her trainer.     Patient was given opportunity to ask questions. Patient verbalized understanding of the plan and was able to repeat key elements of the plan. All questions were answered to their satisfaction.  [facility], MD   I, Gwynneth Aliment, MD, have reviewed all documentation for this visit. The documentation on 12/03/19 for the exam, diagnosis, procedures, and orders are all  accurate and complete.  THE PATIENT IS ENCOURAGED TO PRACTICE SOCIAL DISTANCING DUE TO THE COVID-19 PANDEMIC.

## 2019-12-01 NOTE — Patient Instructions (Addendum)
Wegovy injection, once weekly   Semaglutide injection solution What is this medicine? SEMAGLUTIDE (Sem a GLOO tide) is used to improve blood sugar control in adults with type 2 diabetes. This medicine may be used with other diabetes medicines. This drug may also reduce the risk of heart attack or stroke if you have type 2 diabetes and risk factors for heart disease. This medicine may be used for other purposes; ask your health care provider or pharmacist if you have questions. COMMON BRAND NAME(S): OZEMPIC What should I tell my health care provider before I take this medicine? They need to know if you have any of these conditions:  endocrine tumors (MEN 2) or if someone in your family had these tumors  eye disease, vision problems  history of pancreatitis  kidney disease  stomach problems  thyroid cancer or if someone in your family had thyroid cancer  an unusual or allergic reaction to semaglutide, other medicines, foods, dyes, or preservatives  pregnant or trying to get pregnant  breast-feeding How should I use this medicine? This medicine is for injection under the skin of your upper leg (thigh), stomach area, or upper arm. It is given once every week (every 7 days). You will be taught how to prepare and give this medicine. Use exactly as directed. Take your medicine at regular intervals. Do not take it more often than directed. If you use this medicine with insulin, you should inject this medicine and the insulin separately. Do not mix them together. Do not give the injections right next to each other. Change (rotate) injection sites with each injection. It is important that you put your used needles and syringes in a special sharps container. Do not put them in a trash can. If you do not have a sharps container, call your pharmacist or healthcare provider to get one. A special MedGuide will be given to you by the pharmacist with each prescription and refill. Be sure to read  this information carefully each time. This drug comes with INSTRUCTIONS FOR USE. Ask your pharmacist for directions on how to use this drug. Read the information carefully. Talk to your pharmacist or health care provider if you have questions. Talk to your pediatrician regarding the use of this medicine in children. Special care may be needed. Overdosage: If you think you have taken too much of this medicine contact a poison control center or emergency room at once. NOTE: This medicine is only for you. Do not share this medicine with others. What if I miss a dose? If you miss a dose, take it as soon as you can within 5 days after the missed dose. Then take your next dose at your regular weekly time. If it has been longer than 5 days after the missed dose, do not take the missed dose. Take the next dose at your regular time. Do not take double or extra doses. If you have questions about a missed dose, contact your health care provider for advice. What may interact with this medicine?  other medicines for diabetes Many medications may cause changes in blood sugar, these include:  alcohol containing beverages  antiviral medicines for HIV or AIDS  aspirin and aspirin-like drugs  certain medicines for blood pressure, heart disease, irregular heart beat  chromium  diuretics  female hormones, such as estrogens or progestins, birth control pills  fenofibrate  gemfibrozil  isoniazid  lanreotide  female hormones or anabolic steroids  MAOIs like Carbex, Eldepryl, Marplan, Nardil, and Parnate  medicines  for weight loss  medicines for allergies, asthma, cold, or cough  medicines for depression, anxiety, or psychotic disturbances  niacin  nicotine  NSAIDs, medicines for pain and inflammation, like ibuprofen or naproxen  octreotide  pasireotide  pentamidine  phenytoin  probenecid  quinolone antibiotics such as ciprofloxacin, levofloxacin, ofloxacin  some herbal dietary  supplements  steroid medicines such as prednisone or cortisone  sulfamethoxazole; trimethoprim  thyroid hormones Some medications can hide the warning symptoms of low blood sugar (hypoglycemia). You may need to monitor your blood sugar more closely if you are taking one of these medications. These include:  beta-blockers, often used for high blood pressure or heart problems (examples include atenolol, metoprolol, propranolol)  clonidine  guanethidine  reserpine This list may not describe all possible interactions. Give your health care provider a list of all the medicines, herbs, non-prescription drugs, or dietary supplements you use. Also tell them if you smoke, drink alcohol, or use illegal drugs. Some items may interact with your medicine. What should I watch for while using this medicine? Visit your doctor or health care professional for regular checks on your progress. Drink plenty of fluids while taking this medicine. Check with your doctor or health care professional if you get an attack of severe diarrhea, nausea, and vomiting. The loss of too much body fluid can make it dangerous for you to take this medicine. A test called the HbA1C (A1C) will be monitored. This is a simple blood test. It measures your blood sugar control over the last 2 to 3 months. You will receive this test every 3 to 6 months. Learn how to check your blood sugar. Learn the symptoms of low and high blood sugar and how to manage them. Always carry a quick-source of sugar with you in case you have symptoms of low blood sugar. Examples include hard sugar candy or glucose tablets. Make sure others know that you can choke if you eat or drink when you develop serious symptoms of low blood sugar, such as seizures or unconsciousness. They must get medical help at once. Tell your doctor or health care professional if you have high blood sugar. You might need to change the dose of your medicine. If you are sick or  exercising more than usual, you might need to change the dose of your medicine. Do not skip meals. Ask your doctor or health care professional if you should avoid alcohol. Many nonprescription cough and cold products contain sugar or alcohol. These can affect blood sugar. Pens should never be shared. Even if the needle is changed, sharing may result in passing of viruses like hepatitis or HIV. Wear a medical ID bracelet or chain, and carry a card that describes your disease and details of your medicine and dosage times. Do not become pregnant while taking this medicine. Women should inform their doctor if they wish to become pregnant or think they might be pregnant. There is a potential for serious side effects to an unborn child. Talk to your health care professional or pharmacist for more information. What side effects may I notice from receiving this medicine? Side effects that you should report to your doctor or health care professional as soon as possible:  allergic reactions like skin rash, itching or hives, swelling of the face, lips, or tongue  breathing problems  changes in vision  diarrhea that continues or is severe  lump or swelling on the neck  severe nausea  signs and symptoms of infection like fever or chills;  cough; sore throat; pain or trouble passing urine  signs and symptoms of low blood sugar such as feeling anxious, confusion, dizziness, increased hunger, unusually weak or tired, sweating, shakiness, cold, irritable, headache, blurred vision, fast heartbeat, loss of consciousness  signs and symptoms of kidney injury like trouble passing urine or change in the amount of urine  trouble swallowing  unusual stomach upset or pain  vomiting Side effects that usually do not require medical attention (report to your doctor or health care professional if they continue or are bothersome):  constipation  diarrhea  nausea  pain, redness, or irritation at site where  injected  stomach upset This list may not describe all possible side effects. Call your doctor for medical advice about side effects. You may report side effects to FDA at 1-800-FDA-1088. Where should I keep my medicine? Keep out of the reach of children. Store unopened pens in a refrigerator between 2 and 8 degrees C (36 and 46 degrees F). Do not freeze. Protect from light and heat. After you first use the pen, it can be stored for 56 days at room temperature between 15 and 30 degrees C (59 and 86 degrees F) or in a refrigerator. Throw away your used pen after 56 days or after the expiration date, whichever comes first. Do not store your pen with the needle attached. If the needle is left on, medicine may leak from the pen. NOTE: This sheet is a summary. It may not cover all possible information. If you have questions about this medicine, talk to your doctor, pharmacist, or health care provider.  2020 Elsevier/Gold Standard (2018-12-07 09:41:51)

## 2020-01-11 LAB — HM MAMMOGRAPHY

## 2020-01-11 LAB — HM PAP SMEAR

## 2020-01-13 ENCOUNTER — Encounter: Payer: Self-pay | Admitting: Internal Medicine

## 2020-02-07 LAB — CBC AND DIFFERENTIAL
HCT: 39 (ref 36–46)
Hemoglobin: 13 (ref 12.0–16.0)
Platelets: 259 (ref 150–399)
WBC: 6.6

## 2020-02-07 LAB — BASIC METABOLIC PANEL
BUN: 19 (ref 4–21)
Chloride: 108 (ref 99–108)
Creatinine: 0.9 (ref 0.5–1.1)
Glucose: 95
Potassium: 4.5 (ref 3.4–5.3)
Sodium: 145 (ref 137–147)

## 2020-02-07 LAB — HEPATIC FUNCTION PANEL
ALT: 19 (ref 7–35)
AST: 19 (ref 13–35)
Bilirubin, Direct: 0.14 (ref 0.01–0.4)
Bilirubin, Total: 0.5

## 2020-02-07 LAB — COMPREHENSIVE METABOLIC PANEL
Albumin: 4.2 (ref 3.5–5.0)
Calcium: 9.1 (ref 8.7–10.7)
GFR calc Af Amer: 85
GFR calc non Af Amer: 73

## 2020-02-07 LAB — TSH: TSH: 1.91 (ref 0.41–5.90)

## 2020-02-07 LAB — CBC: RBC: 4.2 (ref 3.87–5.11)

## 2020-02-10 ENCOUNTER — Encounter: Payer: Self-pay | Admitting: Internal Medicine

## 2020-02-16 ENCOUNTER — Ambulatory Visit: Payer: Self-pay | Admitting: Adult Health

## 2020-02-24 LAB — HM COLONOSCOPY

## 2020-02-27 ENCOUNTER — Encounter: Payer: Self-pay | Admitting: Internal Medicine

## 2020-02-28 ENCOUNTER — Encounter: Payer: Self-pay | Admitting: Internal Medicine

## 2020-02-28 ENCOUNTER — Other Ambulatory Visit: Payer: Self-pay

## 2020-02-28 ENCOUNTER — Ambulatory Visit (INDEPENDENT_AMBULATORY_CARE_PROVIDER_SITE_OTHER): Payer: 59 | Admitting: Internal Medicine

## 2020-02-28 VITALS — BP 124/86 | HR 70 | Temp 98.1°F | Ht 68.2 in | Wt 317.2 lb

## 2020-02-28 DIAGNOSIS — Z6841 Body Mass Index (BMI) 40.0 and over, adult: Secondary | ICD-10-CM

## 2020-02-28 DIAGNOSIS — Z0000 Encounter for general adult medical examination without abnormal findings: Secondary | ICD-10-CM

## 2020-02-28 DIAGNOSIS — Z23 Encounter for immunization: Secondary | ICD-10-CM | POA: Diagnosis not present

## 2020-02-28 NOTE — Patient Instructions (Signed)
Health Maintenance, Female Adopting a healthy lifestyle and getting preventive care are important in promoting health and wellness. Ask your health care provider about:  The right schedule for you to have regular tests and exams.  Things you can do on your own to prevent diseases and keep yourself healthy. What should I know about diet, weight, and exercise? Eat a healthy diet   Eat a diet that includes plenty of vegetables, fruits, low-fat dairy products, and lean protein.  Do not eat a lot of foods that are high in solid fats, added sugars, or sodium. Maintain a healthy weight Body mass index (BMI) is used to identify weight problems. It estimates body fat based on height and weight. Your health care provider can help determine your BMI and help you achieve or maintain a healthy weight. Get regular exercise Get regular exercise. This is one of the most important things you can do for your health. Most adults should:  Exercise for at least 150 minutes each week. The exercise should increase your heart rate and make you sweat (moderate-intensity exercise).  Do strengthening exercises at least twice a week. This is in addition to the moderate-intensity exercise.  Spend less time sitting. Even light physical activity can be beneficial. Watch cholesterol and blood lipids Have your blood tested for lipids and cholesterol at 49 years of age, then have this test every 5 years. Have your cholesterol levels checked more often if:  Your lipid or cholesterol levels are high.  You are older than 49 years of age.  You are at high risk for heart disease. What should I know about cancer screening? Depending on your health history and family history, you may need to have cancer screening at various ages. This may include screening for:  Breast cancer.  Cervical cancer.  Colorectal cancer.  Skin cancer.  Lung cancer. What should I know about heart disease, diabetes, and high blood  pressure? Blood pressure and heart disease  High blood pressure causes heart disease and increases the risk of stroke. This is more likely to develop in people who have high blood pressure readings, are of African descent, or are overweight.  Have your blood pressure checked: ? Every 3-5 years if you are 18-39 years of age. ? Every year if you are 40 years old or older. Diabetes Have regular diabetes screenings. This checks your fasting blood sugar level. Have the screening done:  Once every three years after age 40 if you are at a normal weight and have a low risk for diabetes.  More often and at a younger age if you are overweight or have a high risk for diabetes. What should I know about preventing infection? Hepatitis B If you have a higher risk for hepatitis B, you should be screened for this virus. Talk with your health care provider to find out if you are at risk for hepatitis B infection. Hepatitis C Testing is recommended for:  Everyone born from 1945 through 1965.  Anyone with known risk factors for hepatitis C. Sexually transmitted infections (STIs)  Get screened for STIs, including gonorrhea and chlamydia, if: ? You are sexually active and are younger than 49 years of age. ? You are older than 49 years of age and your health care provider tells you that you are at risk for this type of infection. ? Your sexual activity has changed since you were last screened, and you are at increased risk for chlamydia or gonorrhea. Ask your health care provider if   you are at risk.  Ask your health care provider about whether you are at high risk for HIV. Your health care provider may recommend a prescription medicine to help prevent HIV infection. If you choose to take medicine to prevent HIV, you should first get tested for HIV. You should then be tested every 3 months for as long as you are taking the medicine. Pregnancy  If you are about to stop having your period (premenopausal) and  you may become pregnant, seek counseling before you get pregnant.  Take 400 to 800 micrograms (mcg) of folic acid every day if you become pregnant.  Ask for birth control (contraception) if you want to prevent pregnancy. Osteoporosis and menopause Osteoporosis is a disease in which the bones lose minerals and strength with aging. This can result in bone fractures. If you are 65 years old or older, or if you are at risk for osteoporosis and fractures, ask your health care provider if you should:  Be screened for bone loss.  Take a calcium or vitamin D supplement to lower your risk of fractures.  Be given hormone replacement therapy (HRT) to treat symptoms of menopause. Follow these instructions at home: Lifestyle  Do not use any products that contain nicotine or tobacco, such as cigarettes, e-cigarettes, and chewing tobacco. If you need help quitting, ask your health care provider.  Do not use street drugs.  Do not share needles.  Ask your health care provider for help if you need support or information about quitting drugs. Alcohol use  Do not drink alcohol if: ? Your health care provider tells you not to drink. ? You are pregnant, may be pregnant, or are planning to become pregnant.  If you drink alcohol: ? Limit how much you use to 0-1 drink a day. ? Limit intake if you are breastfeeding.  Be aware of how much alcohol is in your drink. In the [place] one drink equals one 12 oz bottle of beer (355 mL), one 5 oz glass of wine (148 mL), or one 1 oz glass of hard liquor (44 mL). General instructions  Schedule regular health, dental, and eye exams.  Stay current with your vaccines.  Tell your health care provider if: ? You often feel depressed. ? You have ever been abused or do not feel safe at home. Summary  Adopting a healthy lifestyle and getting preventive care are important in promoting health and wellness.  Follow your health care provider's instructions about healthy  diet, exercising, and getting tested or screened for diseases.  Follow your health care provider's instructions on monitoring your cholesterol and blood pressure. This information is not intended to replace advice given to you by your health care provider. Make sure you discuss any questions you have with your health care provider. Document Revised: 03/17/2018 Document Reviewed: 03/17/2018 Elsevier Patient Education  2020 [employer]  

## 2020-02-28 NOTE — Progress Notes (Signed)
I,Katawbba Wiggins,acting as a Neurosurgeon for Gwynneth Aliment, MD.,have documented all relevant documentation on the behalf of Gwynneth Aliment, MD,as directed by  Gwynneth Aliment, MD while in the presence of Gwynneth Aliment, MD.  This visit occurred during the SARS-CoV-2 public health emergency.  Safety protocols were in place, including screening questions prior to the visit, additional usage of staff PPE, and extensive cleaning of exam room while observing appropriate contact time as indicated for disinfecting solutions.  Subjective:     Patient ID: Katherine Conway , female    DOB: 04/19/1970 , 49 y.o.   MRN: [id number]   Chief Complaint  Patient presents with  . Annual Exam    HPI  She is here today for a full physical examination.  She is followed by Dr. Estanislado Pandy for her GYN care. She was last seen Sept/Oct 2021.  She has no specific concerns or complaints at this time.     Past Medical History:  Diagnosis Date  . Allergy      Family History  Problem Relation Age of Onset  . Hypertension Mother   . Hypertension Father   . Breast cancer Maternal Aunt   . Hypertension Maternal Grandmother   . Hypertension Maternal Grandfather   . Hypertension Paternal Grandmother   . Heart disease Paternal Grandfather   . Hypertension Paternal Grandfather      Current Outpatient Medications:  .  Loratadine (CLARITIN PO), Take by mouth., Disp: , Rfl:  .  VITAMIN D PO, Take 10,000 Doses by mouth daily. , Disp: , Rfl:    No Known Allergies    The patient states she uses none for birth control. Last LMP was Patient's last menstrual period was 03/31/2013 (approximate).. Negative for Dysmenorrhea Negative for: breast discharge, breast lump(s), breast pain and breast self exam. Associated symptoms include abnormal vaginal bleeding. Pertinent negatives include abnormal bleeding (hematology), anxiety, decreased libido, depression, difficulty falling sleep, dyspareunia, history of infertility,  nocturia, sexual dysfunction, sleep disturbances, urinary incontinence, urinary urgency, vaginal discharge and vaginal itching. Diet regular.The patient states her exercise level is  intermittent.   . The patient's tobacco use is:  Social History   Tobacco Use  Smoking Status Never Smoker  Smokeless Tobacco Never Used  . She has been exposed to passive smoke. The patient's alcohol use is:  Social History   Substance and Sexual Activity  Alcohol Use Yes   Comment: occasional wine     Review of Systems  Constitutional: Negative.   HENT: Negative.   Eyes: Negative.   Respiratory: Negative.   Cardiovascular: Negative.   Gastrointestinal: Negative.   Endocrine: Negative.   Genitourinary: Negative.   Musculoskeletal: Negative.   Skin: Negative.   Allergic/Immunologic: Negative.   Neurological: Negative.   Hematological: Negative.   Psychiatric/Behavioral: Negative.      Today's Vitals   02/28/20 1128  BP: 124/86  Pulse: 70  Temp: 98.1 F (36.7 C)  TempSrc: Oral  Weight: (!) 317 lb 3.2 oz (143.9 kg)  Height: 5' 8.2" (1.732 m)   Body mass index is 47.95 kg/m.  Wt Readings from Last 3 Encounters:  02/28/20 (!) 317 lb 3.2 oz (143.9 kg)  12/01/19 (!) 312 lb (141.5 kg)  09/13/19 (!) 310 lb (140.6 kg)   Objective:  Physical Exam Constitutional:      General: She is not in acute distress.    Appearance: Normal appearance. She is well-developed. She is obese.  HENT:     Head: Normocephalic and atraumatic.  Right Ear: Hearing, tympanic membrane, ear canal and external ear normal. There is no impacted cerumen.     Left Ear: Hearing, tympanic membrane, ear canal and external ear normal. There is no impacted cerumen.     Nose:     Comments: Deferred, masked    Mouth/Throat:     Comments: Deferred, masked Eyes:     General: Lids are normal.     Extraocular Movements: Extraocular movements intact.     Conjunctiva/sclera: Conjunctivae normal.     Pupils: Pupils are  equal, round, and reactive to light.     Funduscopic exam:    Right eye: No papilledema.        Left eye: No papilledema.  Neck:     Thyroid: No thyroid mass.     Vascular: No carotid bruit.  Cardiovascular:     Rate and Rhythm: Normal rate and regular rhythm.     Pulses: Normal pulses.     Heart sounds: Normal heart sounds. No murmur heard.   Pulmonary:     Effort: Pulmonary effort is normal.     Breath sounds: Normal breath sounds.  Chest:     Breasts: Tanner Score is 5.        Right: Normal.        Left: Normal.  Abdominal:     General: Bowel sounds are normal. There is no distension.     Palpations: Abdomen is soft.     Tenderness: There is no abdominal tenderness.     Comments: Obese, soft  Genitourinary:    Comments: Deferred Musculoskeletal:        General: No swelling. Normal range of motion.     Cervical back: Full passive range of motion without pain, normal range of motion and neck supple.     Right lower leg: No edema.     Left lower leg: No edema.  Skin:    General: Skin is warm and dry.     Capillary Refill: Capillary refill takes less than 2 seconds.  Neurological:     General: No focal deficit present.     Mental Status: She is alert and oriented to person, place, and time.     Cranial Nerves: No cranial nerve deficit.     Sensory: No sensory deficit.  Psychiatric:        Mood and Affect: Mood normal.        Behavior: Behavior normal.        Thought Content: Thought content normal.        Judgment: Judgment normal.         Assessment And Plan:     1. Routine general medical examination at health care facility Comments: A full exam was performed.  Importance of monthly self breast exams was discussed with the patient. PATIENT IS ADVISED TO GET 30-45 MINUTES REGULAR EXERCISE NO LESS THAN FOUR TO FIVE DAYS PER WEEK - BOTH WEIGHTBEARING EXERCISES AND AEROBIC ARE RECOMMENDED.  PATIENT IS ADVISED TO FOLLOW A HEALTHY DIET WITH AT LEAST SIX FRUITS/VEGGIES  PER DAY, DECREASE INTAKE OF RED MEAT, AND TO INCREASE FISH INTAKE TO TWO DAYS PER WEEK.  MEATS/FISH SHOULD NOT BE FRIED, BAKED OR BROILED IS PREFERABLE.  I SUGGEST WEARING SPF 50 SUNSCREEN ON EXPOSED PARTS AND ESPECIALLY WHEN IN THE DIRECT SUNLIGHT FOR AN EXTENDED PERIOD OF TIME.  PLEASE AVOID FAST FOOD RESTAURANTS AND INCREASE YOUR WATER INTAKE.   2. Need for vaccination Comments: She was given flu vaccine.  - Flu Vaccine QUAD  6+ mos PF IM (Fluarix Quad PF)  3. Class 3 severe obesity due to excess calories without serious comorbidity with body mass index (BMI) of 45.0 to 49.9 in adult [facility]) Comments: BMI 47. She admits that she has not been successful on her own with her weight loss efforts. We discussed the use of Wegovy for obesity. She denies family/personal h/o thyroid cancer. She was given 0.25mg  samples and advised how to administer the medication. She understands that she will not take her next dose until next week. She is reminded to stop eating when full. She will rto in 4-6 weeks for re-evaluation.  Possible side effects d/w patient.    Patient was given opportunity to ask questions. Patient verbalized understanding of the plan and was able to repeat key elements of the plan. All questions were answered to their satisfaction.  Gwynneth Aliment, MD   I, Gwynneth Aliment, MD, have reviewed all documentation for this visit. The documentation on 03/03/20 for the exam, diagnosis, procedures, and orders are all accurate and complete.  THE PATIENT IS ENCOURAGED TO PRACTICE SOCIAL DISTANCING DUE TO THE COVID-19 PANDEMIC.

## 2020-03-13 ENCOUNTER — Encounter: Payer: Self-pay | Admitting: Internal Medicine

## 2020-03-16 ENCOUNTER — Ambulatory Visit (INDEPENDENT_AMBULATORY_CARE_PROVIDER_SITE_OTHER): Payer: 59

## 2020-03-16 DIAGNOSIS — Z23 Encounter for immunization: Secondary | ICD-10-CM | POA: Diagnosis not present

## 2020-03-16 NOTE — Progress Notes (Signed)
   Covid-19 Vaccination Clinic  Name:  Katherine Conway    MRN: [id number] DOB: 1970/04/23  03/16/2020  Katherine Conway was observed post Covid-19 immunization for 15 minutes without incident. She was provided with Vaccine Information Sheet and instruction to access the V-Safe system.   Katherine Conway was instructed to call 911 with any severe reactions post vaccine: Marland Kitchen Difficulty breathing  . Swelling of face and throat  . A fast heartbeat  . A bad rash all over body  . Dizziness and weakness   Immunizations Administered    Name Date Dose VIS Date Route   Moderna COVID-19 Vaccine 03/16/2020 -- -- --

## 2020-04-11 ENCOUNTER — Encounter: Payer: Self-pay | Admitting: Adult Health

## 2020-04-11 ENCOUNTER — Other Ambulatory Visit: Payer: Self-pay

## 2020-04-11 ENCOUNTER — Ambulatory Visit: Payer: 59 | Admitting: Adult Health

## 2020-04-11 VITALS — BP 150/97 | HR 68 | Ht 68.0 in | Wt 308.8 lb

## 2020-04-11 DIAGNOSIS — Z9989 Dependence on other enabling machines and devices: Secondary | ICD-10-CM | POA: Diagnosis not present

## 2020-04-11 DIAGNOSIS — G4733 Obstructive sleep apnea (adult) (pediatric): Secondary | ICD-10-CM | POA: Diagnosis not present

## 2020-04-11 NOTE — Patient Instructions (Signed)
Continue using CPAP nightly and greater than 4 hours each night °If your symptoms worsen or you develop new symptoms please let us know.  ° °

## 2020-04-11 NOTE — Progress Notes (Addendum)
PATIENT: Katherine Conway DOB: Jul 22, 1970  REASON FOR VISIT: follow up HISTORY FROM: patient  HISTORY OF PRESENT ILLNESS: Today 04/11/20;  Katherine Conway is a 50 year old female with a history of obstructive sleep apnea on CPAP.  Her download indicates that she used her machine nightly for compliance of 100%.  She used her machine greater than 4 hours each night.  On average she uses her machine 7 hours and 55 minutes.  AHI is 0.4 on 6 to 16 cm of water with EPR of 1.  Leak in the 95th percentile is 1.  She states that she could tell the difference in how she felt the first night she used it.  She states that she even uses it with naps.  Overall the patient is doing well.  HISTORY Jasdeep Dejarnett Baldwinis a 50 y.o. year old  African American female patientand seen here upon a referralon 09/13/2019 fChiefconcernaccording to patient : " I lack energy and I gained weight".   I have the pleasure of seeing Katherine Conway today,a right -handed Black or [place] American female with a possible sleep disorder. She  has a past medical history of Allergy., obesity and fatigue.   Sleeprelevant medical history: none. Familymedical /sleep history:No other family member on CPAP with OSA, father is a loud snorer.  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 The patient currently works from home.  Pets are not present . Tobacco use: none . ETOH use ; socially ,  Caffeine intake in form of Coffee( 2/ week) Soda(seldomly) Tea ( rare ) and no energy drinks. Regular exercise started late April, after second vaccine.    Sleep habits are as follows:The patient's dinner time is between 7-8 PM. The patient goes to bed at 11 PM and promptly goes to sleep- continues to sleep for 2-3 hours, wakes for unknown reasons- the first time at 3-4 AM.   The preferred sleep position is on either  side, with the support of 1 pillow. Dreams are reportedly frequent.  6 AM is the usual rise time for the GYM. The patient wakes up with an alarm.   She reports not feeling refreshed or restored in AM, with symptoms such as dull morning headaches and residual fatigue/ it is harder to multitask..  Naps are taken frequently," I can nap any time"  lasting from 2-40 minutes and are more refreshing than nocturnal sleep.   REVIEW OF SYSTEMS: Out of a complete 14 system review of symptoms, the patient complains only of the following symptoms, and all other reviewed systems are negative.  FSS 26 ESS 7  ALLERGIES: No Known Allergies  HOME MEDICATIONS: Outpatient Medications Prior to Visit  Medication Sig Dispense Refill  . Loratadine (CLARITIN PO) Take by mouth as needed.    Marland Kitchen VITAMIN D PO Take 1,000 Units by mouth daily.     No facility-administered medications prior to visit.    PAST MEDICAL HISTORY: Past Medical History:  Diagnosis Date  . Allergy     PAST SURGICAL HISTORY: Past Surgical History:  Procedure Laterality Date  . CESAREAN SECTION      FAMILY HISTORY: Family History  Problem Relation Age of Onset  . Hypertension Mother   . Hypertension Father   . Breast cancer Maternal Aunt   . Hypertension Maternal Grandmother   . Hypertension Maternal Grandfather   . Hypertension Paternal  Grandmother   . Heart disease Paternal Grandfather   . Hypertension Paternal Grandfather     SOCIAL HISTORY: Social History   Socioeconomic History  . Marital status: Divorced    Spouse name: Not on file  . Number of children: Not on file  . Years of education: Not on file  . Highest education level: Not on file  Occupational History  . Occupation: [employer]   Tobacco Use  . Smoking status: Never Smoker  . Smokeless tobacco: Never Used  Vaping Use  . Vaping Use: Never used  Substance and Sexual Activity  . Alcohol use: Yes    Comment: occasional wine   . Drug use: No   . Sexual activity: Not Currently  Other Topics Concern  . Not on file  Social History Narrative   Coffee sometimes    Right handed    Social Determinants of Health   Financial Resource Strain: Not on file  Food Insecurity: Not on file  Transportation Needs: Not on file  Physical Activity: Not on file  Stress: Not on file  Social Connections: Not on file  Intimate Partner Violence: Not on file      PHYSICAL EXAM  Vitals:   04/11/20 0827  BP: (!) 150/97  Pulse: 68  Weight: (!) 308 lb 12.8 oz (140.1 kg)  Height: 5\' 8"  (1.727 m)   Body mass index is 46.95 kg/m.  Generalized: Well developed, in no acute distress  Chest: Lungs clear to auscultation bilaterally  Neurological examination  Mentation: Alert oriented to time, place, history taking. Follows all commands speech and language fluent Cranial nerve II-XII: Extraocular movements were full, visual field were full on confrontational test Head turning and shoulder shrug  were normal and symmetric. Motor: The motor testing reveals 5 over 5 strength of all 4 extremities. Good symmetric motor tone is noted throughout.  Sensory: Sensory testing is intact to soft touch on all 4 extremities. No evidence of extinction is noted.  Gait and station: Gait is normal.    DIAGNOSTIC DATA (LABS, IMAGING, TESTING) - I reviewed patient records, labs, notes, testing and imaging myself where available.  Lab Results  Component Value Date   WBC 6.6 02/07/2020   HGB 13.0 02/07/2020   HCT 39 02/07/2020   MCV 91 02/17/2019   PLT 259 02/07/2020      Component Value Date/Time   NA 145 02/07/2020 0000   K 4.5 02/07/2020 0000   CL 108 02/07/2020 0000   CO2 22 02/17/2019 1207   GLUCOSE 96 02/17/2019 1207   BUN 19 02/07/2020 0000   CREATININE 0.9 02/07/2020 0000   CREATININE 0.85 02/17/2019 1207   CALCIUM 9.1 02/07/2020 0000   PROT 7.2 02/17/2019 1207   ALBUMIN 4.2 02/07/2020 0000   ALBUMIN 4.7 02/17/2019 1207   AST 19 02/07/2020  0000   ALT 19 02/07/2020 0000   ALKPHOS 81 02/17/2019 1207   BILITOT 0.7 02/17/2019 1207   GFRNONAA 73 02/07/2020 0000   GFRAA 85 02/07/2020 0000   Lab Results  Component Value Date   CHOL 182 02/17/2019   HDL 54 02/17/2019   LDLCALC 116 (H) 02/17/2019   TRIG 65 02/17/2019   CHOLHDL 3.4 02/17/2019   Lab Results  Component Value Date   HGBA1C 5.3 02/17/2019   No results found for: 13/03/2019 Lab Results  Component Value Date   TSH 1.91 02/07/2020      ASSESSMENT AND PLAN 50 y.o. year old female  has a past medical history of  Allergy. here with:  1. OSA on CPAP  - CPAP compliance excellent - Good treatment of AHI  - Encourage patient to use CPAP nightly and > 4 hours each night - F/U in 1 year or sooner if needed   I spent 20 minutes of face-to-face and non-face-to-face time with patient.  This included previsit chart review, lab review, study review, order entry, electronic health record documentation, patient education.  Ward Givens, MSN, NP-C 04/11/2020, 8:34 AM [facility] [address] [place], [place] [address] [phone]

## 2020-04-19 ENCOUNTER — Encounter: Payer: Self-pay | Admitting: Internal Medicine

## 2020-04-19 ENCOUNTER — Other Ambulatory Visit: Payer: Self-pay

## 2020-04-19 ENCOUNTER — Other Ambulatory Visit: Payer: Self-pay | Admitting: Internal Medicine

## 2020-04-19 ENCOUNTER — Ambulatory Visit: Payer: 59 | Admitting: Internal Medicine

## 2020-04-19 VITALS — BP 116/74 | HR 82 | Temp 97.9°F | Ht 68.0 in | Wt 299.4 lb

## 2020-04-19 DIAGNOSIS — Z6841 Body Mass Index (BMI) 40.0 and over, adult: Secondary | ICD-10-CM

## 2020-04-19 MED ORDER — SEMAGLUTIDE-WEIGHT MANAGEMENT 1 MG/0.5ML ~~LOC~~ SOAJ: 1.0000 mg | SUBCUTANEOUS | 0 refills | Status: DC

## 2020-04-19 MED ORDER — SEMAGLUTIDE-WEIGHT MANAGEMENT 1.7 MG/0.75ML ~~LOC~~ SOAJ: 1.7000 mg | SUBCUTANEOUS | 0 refills | Status: DC

## 2020-04-19 MED ORDER — SEMAGLUTIDE-WEIGHT MANAGEMENT 0.5 MG/0.5ML ~~LOC~~ SOAJ: 0.5000 mg | SUBCUTANEOUS | 0 refills | Status: DC

## 2020-04-19 MED ORDER — SEMAGLUTIDE-WEIGHT MANAGEMENT 2.4 MG/0.75ML ~~LOC~~ SOAJ: 2.4000 mg | SUBCUTANEOUS | 0 refills | Status: DC

## 2020-04-19 NOTE — Patient Instructions (Signed)

## 2020-04-19 NOTE — Progress Notes (Signed)
I,Katawbba Wiggins,acting as a Neurosurgeon for Gwynneth Aliment, MD.,have documented all relevant documentation on the behalf of Gwynneth Aliment, MD,as directed by  Gwynneth Aliment, MD while in the presence of Gwynneth Aliment, MD.  This visit occurred during the SARS-CoV-2 public health emergency.  Safety protocols were in place, including screening questions prior to the visit, additional usage of staff PPE, and extensive cleaning of exam room while observing appropriate contact time as indicated for disinfecting solutions.  Subjective:     Patient ID: Katherine Conway , female    DOB: June 10, 1970 , 50 y.o.   MRN: [id number]   Chief Complaint  Patient presents with  . Obesity    HPI  The patient is here today for a follow-up on her weight.  The patient was started [facility] at her last visit.  The patient reports no complications with the medications.  She adds that she has made some lifestyle changes as well. She has decreased her portion sizes and is now following [employer] meal plan. She has five "fuelings" per day and one lean/green meal.     Past Medical History:  Diagnosis Date  . Allergy      Family History  Problem Relation Age of Onset  . Hypertension Mother   . Hypertension Father   . Breast cancer Maternal Aunt   . Hypertension Maternal Grandmother   . Hypertension Maternal Grandfather   . Hypertension Paternal Grandmother   . Heart disease Paternal Grandfather   . Hypertension Paternal Grandfather      Current Outpatient Medications:  .  Loratadine (CLARITIN PO), Take by mouth as needed., Disp: , Rfl:  .  [START ON 05/18/2020] Semaglutide-Weight Management 0.5 MG/0.5ML SOAJ, Inject 0.5 mg into the skin once a week for 28 days., Disp: 2 mL, Rfl: 0 .  [START ON 06/16/2020] Semaglutide-Weight Management 1 MG/0.5ML SOAJ, Inject 1 mg into the skin once a week for 28 days., Disp: 2 mL, Rfl: 0 .  [START ON 07/15/2020] Semaglutide-Weight Management 1.7 MG/0.75ML SOAJ, Inject 1.7 mg  into the skin once a week for 28 days., Disp: 3 mL, Rfl: 0 .  [START ON 08/13/2020] Semaglutide-Weight Management 2.4 MG/0.75ML SOAJ, Inject 2.4 mg into the skin once a week for 28 days., Disp: 3 mL, Rfl: 0 .  VITAMIN D PO, Take 1,000 Units by mouth daily., Disp: , Rfl:    No Known Allergies   Review of Systems  Constitutional: Negative.   Respiratory: Negative.   Cardiovascular: Negative.   Gastrointestinal: Negative.   Psychiatric/Behavioral: Negative.   All other systems reviewed and are negative.    Today's Vitals   04/19/20 1230  BP: 116/74  Pulse: 82  Temp: 97.9 F (36.6 C)  TempSrc: Oral  Weight: 299 lb 6.4 oz (135.8 kg)  Height: 5\' 8"  (1.727 m)   Body mass index is 45.52 kg/m.  Wt Readings from Last 3 Encounters:  04/19/20 299 lb 6.4 oz (135.8 kg)  04/11/20 (!) 308 lb 12.8 oz (140.1 kg)  02/28/20 (!) 317 lb 3.2 oz (143.9 kg)   Objective:  Physical Exam Vitals and nursing note reviewed.  Constitutional:      Appearance: Normal appearance. She is obese.  HENT:     Head: Normocephalic and atraumatic.  Cardiovascular:     Rate and Rhythm: Normal rate and regular rhythm.     Heart sounds: Normal heart sounds.  Pulmonary:     Breath sounds: Normal breath sounds.  Skin:    General: Skin  is warm.  Neurological:     General: No focal deficit present.     Mental Status: She is alert and oriented to person, place, and time.         Assessment And Plan:     1. Class 3 severe obesity due to excess calories without serious comorbidity with body mass index (BMI) of 45.0 to 49.9 in adult [facility]) She was congratulated on her 18 pound weight loss since Nov 2021. She will c/w Wegovy, titration schedule was sent to [facility] pharmacy.She is reminded to stop eating when full to avoid GI distress.  She will f/u in 6 - 8 weeks for re-evaluation. She is encouraged to initially strive for BMI less than 40 to decrease cardiac risk. Advised to aim for at least 150 minutes of exercise per  week.   Patient was given opportunity to ask questions. Patient verbalized understanding of the plan and was able to repeat key elements of the plan. All questions were answered to their satisfaction.  Gwynneth Aliment, MD   I, Gwynneth Aliment, MD, have reviewed all documentation for this visit. The documentation on 04/23/20 for the exam, diagnosis, procedures, and orders are all accurate and complete.  THE PATIENT IS ENCOURAGED TO PRACTICE SOCIAL DISTANCING DUE TO THE COVID-19 PANDEMIC.

## 2020-05-31 ENCOUNTER — Other Ambulatory Visit: Payer: Self-pay

## 2020-05-31 ENCOUNTER — Ambulatory Visit: Payer: 59 | Admitting: Internal Medicine

## 2020-05-31 ENCOUNTER — Encounter: Payer: Self-pay | Admitting: Internal Medicine

## 2020-05-31 VITALS — BP 116/84 | HR 60 | Temp 98.0°F | Ht 68.0 in | Wt 286.2 lb

## 2020-05-31 DIAGNOSIS — Z6841 Body Mass Index (BMI) 40.0 and over, adult: Secondary | ICD-10-CM

## 2020-05-31 NOTE — Patient Instructions (Signed)

## 2020-05-31 NOTE — Progress Notes (Signed)
I,Katawbba Wiggins,acting as a Neurosurgeon for Gwynneth Aliment, MD.,have documented all relevant documentation on the behalf of Gwynneth Aliment, MD,as directed by  Gwynneth Aliment, MD while in the presence of Gwynneth Aliment, MD.  This visit occurred during the SARS-CoV-2 public health emergency.  Safety protocols were in place, including screening questions prior to the visit, additional usage of staff PPE, and extensive cleaning of exam room while observing appropriate contact time as indicated for disinfecting solutions.  Subjective:     Patient ID: Katherine Conway , female    DOB: 1970/08/06 , 50 y.o.   MRN: [id number]   Chief Complaint  Patient presents with  . Obesity    HPI  The patient is here today for a follow-up on her weight.   The patient said she has been out of the [facility] for about 2 weeks.Unfortunately, she has had difficulty in filling this at the pharmacy. She is still doing well on her [employer] program.     Past Medical History:  Diagnosis Date  . Allergy      Family History  Problem Relation Age of Onset  . Hypertension Mother   . Hypertension Father   . Breast cancer Maternal Aunt   . Hypertension Maternal Grandmother   . Hypertension Maternal Grandfather   . Hypertension Paternal Grandmother   . Heart disease Paternal Grandfather   . Hypertension Paternal Grandfather      Current Outpatient Medications:  .  Loratadine (CLARITIN PO), Take by mouth as needed., Disp: , Rfl:  .  Semaglutide-Weight Management 0.5 MG/0.5ML SOAJ, Inject 0.5 mg into the skin once a week for 28 days., Disp: 2 mL, Rfl: 0 .  [START ON 06/16/2020] Semaglutide-Weight Management 1 MG/0.5ML SOAJ, Inject 1 mg into the skin once a week for 28 days. (Patient not taking: Reported on 05/31/2020), Disp: 2 mL, Rfl: 0 .  [START ON 07/15/2020] Semaglutide-Weight Management 1.7 MG/0.75ML SOAJ, Inject 1.7 mg into the skin once a week for 28 days. (Patient not taking: Reported on 05/31/2020), Disp: 3 mL,  Rfl: 0 .  [START ON 08/13/2020] Semaglutide-Weight Management 2.4 MG/0.75ML SOAJ, Inject 2.4 mg into the skin once a week for 28 days. (Patient not taking: Reported on 05/31/2020), Disp: 3 mL, Rfl: 0 .  VITAMIN D PO, Take 1,000 Units by mouth daily. (Patient not taking: Reported on 05/31/2020), Disp: , Rfl:    No Known Allergies   Review of Systems  Constitutional: Negative.   Respiratory: Negative.   Cardiovascular: Negative.   Gastrointestinal: Negative.   Psychiatric/Behavioral: Negative.   All other systems reviewed and are negative.    Today's Vitals   05/31/20 1602  BP: 116/84  Pulse: 60  Temp: 98 F (36.7 C)  TempSrc: Oral  Weight: 286 lb 3.2 oz (129.8 kg)  Height: 5\' 8"  (1.727 m)   Body mass index is 43.52 kg/m.  Wt Readings from Last 3 Encounters:  05/31/20 286 lb 3.2 oz (129.8 kg)  04/19/20 299 lb 6.4 oz (135.8 kg)  04/11/20 (!) 308 lb 12.8 oz (140.1 kg)   Objective:  Physical Exam Vitals and nursing note reviewed.  Constitutional:      Appearance: Normal appearance. She is obese.  HENT:     Head: Normocephalic and atraumatic.     Nose:     Comments: Masked     Mouth/Throat:     Comments: Masked  Cardiovascular:     Rate and Rhythm: Normal rate and regular rhythm.  Heart sounds: Normal heart sounds.  Pulmonary:     Effort: Pulmonary effort is normal.     Breath sounds: Normal breath sounds.  Musculoskeletal:     Cervical back: Normal range of motion.  Skin:    General: Skin is warm.  Neurological:     General: No focal deficit present.     Mental Status: She is alert and oriented to person, place, and time.         Assessment And Plan:     1. Class 3 severe obesity due to excess calories without serious comorbidity with body mass index (BMI) of 40.0 to 44.9 in adult [facility]) Comments: She was congratulated on her 13 pound wieght loss since her last visit. Encouraged to aim for at least 150 minutes of exercise per week.  I will continue to try to  get [facility] approved. She will f/u in 8 weeks for re-evaluation.    Patient was given opportunity to ask questions. Patient verbalized understanding of the plan and was able to repeat key elements of the plan. All questions were answered to their satisfaction.   I, Gwynneth Aliment, MD, have reviewed all documentation for this visit. The documentation on 06/05/20 for the exam, diagnosis, procedures, and orders are all accurate and complete.  THE PATIENT IS ENCOURAGED TO PRACTICE SOCIAL DISTANCING DUE TO THE COVID-19 PANDEMIC.

## 2020-07-30 ENCOUNTER — Encounter: Payer: Self-pay | Admitting: Internal Medicine

## 2020-07-30 ENCOUNTER — Other Ambulatory Visit: Payer: Self-pay

## 2020-07-30 ENCOUNTER — Ambulatory Visit: Payer: 59 | Admitting: Internal Medicine

## 2020-07-30 VITALS — BP 112/74 | HR 59 | Temp 98.1°F | Ht 68.0 in | Wt 281.2 lb

## 2020-07-30 DIAGNOSIS — J301 Allergic rhinitis due to pollen: Secondary | ICD-10-CM | POA: Diagnosis not present

## 2020-07-30 DIAGNOSIS — Z6841 Body Mass Index (BMI) 40.0 and over, adult: Secondary | ICD-10-CM | POA: Diagnosis not present

## 2020-07-30 DIAGNOSIS — Z79899 Other long term (current) drug therapy: Secondary | ICD-10-CM | POA: Diagnosis not present

## 2020-07-30 NOTE — Patient Instructions (Signed)
Exercising to Stay Healthy To become healthy and stay healthy, it is recommended that you do moderate-intensity and vigorous-intensity exercise. You can tell that you are exercising at a moderate intensity if your heart starts beating faster and you start breathing faster but can still hold a conversation. You can tell that you are exercising at a vigorous intensity if you are breathing much harder and faster and cannot hold a conversation while exercising. Exercising regularly is important. It has many health benefits, such as:  Improving overall fitness, flexibility, and endurance.  Increasing bone density.  Helping with weight control.  Decreasing body fat.  Increasing muscle strength.  Reducing stress and tension.  Improving overall health. How often should I exercise? Choose an activity that you enjoy, and set realistic goals. Your health care provider can help you make an activity plan that works for you. Exercise regularly as told by your health care provider. This may include:  Doing strength training two times a week, such as: ? Lifting weights. ? Using resistance bands. ? Push-ups. ? Sit-ups. ? Yoga.  Doing a certain intensity of exercise for a given amount of time. Choose from these options: ? A total of 150 minutes of moderate-intensity exercise every week. ? A total of 75 minutes of vigorous-intensity exercise every week. ? A mix of moderate-intensity and vigorous-intensity exercise every week. Children, pregnant women, people who have not exercised regularly, people who are overweight, and older adults may need to talk with a health care provider about what activities are safe to do. If you have a medical condition, be sure to talk with your health care provider before you start a new exercise program. What are some exercise ideas? Moderate-intensity exercise ideas include:  Walking 1 mile (1.6 km) in about 15  minutes.  Biking.  Hiking.  Golfing.  Dancing.  Water aerobics. Vigorous-intensity exercise ideas include:  Walking 4.5 miles (7.2 km) or more in about 1 hour.  Jogging or running 5 miles (8 km) in about 1 hour.  Biking 10 miles (16.1 km) or more in about 1 hour.  Lap swimming.  Roller-skating or in-line skating.  Cross-country skiing.  Vigorous competitive sports, such as football, basketball, and soccer.  Jumping rope.  Aerobic dancing.   What are some everyday activities that can help me to get exercise?  Yard work, such as: ? Pushing a lawn mower. ? Raking and bagging leaves.  Washing your car.  Pushing a stroller.  Shoveling snow.  Gardening.  Washing windows or floors. How can I be more active in my day-to-day activities?  Use stairs instead of an elevator.  Take a walk during your lunch break.  If you drive, park your car farther away from your work or school.  If you take public transportation, get off one stop early and walk the rest of the way.  Stand up or walk around during all of your indoor phone calls.  Get up, stretch, and walk around every 30 minutes throughout the day.  Enjoy exercise with a friend. Support to continue exercising will help you keep a regular routine of activity. What guidelines can I follow while exercising?  Before you start a new exercise program, talk with your health care provider.  Do not exercise so much that you hurt yourself, feel dizzy, or get very short of breath.  Wear comfortable clothes and wear shoes with good support.  Drink plenty of water while you exercise to prevent dehydration or heat stroke.  Work out until   your breathing and your heartbeat get faster. Where to find more information  [place] Department of Health and Human Services: www.hhs.gov  Centers for Disease Control and Prevention (CDC): www.cdc.gov Summary  Exercising regularly is important. It will improve your overall fitness,  flexibility, and endurance.  Regular exercise also will improve your overall health. It can help you control your weight, reduce stress, and improve your bone density.  Do not exercise so much that you hurt yourself, feel dizzy, or get very short of breath.  Before you start a new exercise program, talk with your health care provider. This information is not intended to replace advice given to you by your health care provider. Make sure you discuss any questions you have with your health care provider. Document Revised: 03/06/2017 Document Reviewed: 02/12/2017 Elsevier Patient Education  2021 Elsevier Inc.  

## 2020-07-30 NOTE — Progress Notes (Signed)
I,Katawbba Wiggins,acting as a Education administrator for Maximino Greenland, MD.,have documented all relevant documentation on the behalf of Maximino Greenland, MD,as directed by  Maximino Greenland, MD while in the presence of Maximino Greenland, MD.  This visit occurred during the SARS-CoV-2 public health emergency.  Safety protocols were in place, including screening questions prior to the visit, additional usage of staff PPE, and extensive cleaning of exam room while observing appropriate contact time as indicated for disinfecting solutions.  Subjective:     Patient ID: Katherine Conway , female    DOB: [birth year] , 50 y.o.   MRN: [id number]   Chief Complaint  Patient presents with  . Obesity    HPI  The patient is here today for a follow-up on her weight. Her insurance did not cover [phone]. She was then given sample of Ozempic and is now on 0.51m weekly. She has not had any issues with the medication.     Past Medical History:  Diagnosis Date  . Allergy      Family History  Problem Relation Age of Onset  . Hypertension Mother   . Hypertension Father   . Breast cancer Maternal Aunt   . Hypertension Maternal Grandmother   . Hypertension Maternal Grandfather   . Hypertension Paternal Grandmother   . Heart disease Paternal Grandfather   . Hypertension Paternal Grandfather      Current Outpatient Medications:  .  Loratadine (CLARITIN PO), Take by mouth as needed., Disp: , Rfl:  .  Semaglutide,0.25 or 0.5MG/DOS, (OZEMPIC, 0.25 OR 0.5 MG/DOSE,) 2 MG/1.5ML SOPN, Inject 0.25 mg into the skin., Disp: , Rfl:  .  VITAMIN D PO, Take 1,000 Units by mouth daily., Disp: , Rfl:    No Known Allergies   Review of Systems  Constitutional: Negative.   HENT: Positive for rhinorrhea.   Eyes: Negative.   Respiratory: Negative.   Cardiovascular: Negative.   Gastrointestinal: Negative.   All other systems reviewed and are negative.    Today's Vitals   07/30/20 1550  BP: 112/74  Pulse: (!) 59  Temp: 98.1  F (36.7 C)  TempSrc: Oral  Weight: 281 lb 3.2 oz (127.6 kg)  Height: '5\' 8"'  (1.727 m)   Body mass index is 42.76 kg/m.  Wt Readings from Last 3 Encounters:  07/30/20 281 lb 3.2 oz (127.6 kg)  05/31/20 286 lb 3.2 oz (129.8 kg)  04/19/20 299 lb 6.4 oz (135.8 kg)   Objective:  Physical Exam Vitals and nursing note reviewed.  Constitutional:      Appearance: Normal appearance.  HENT:     Head: Normocephalic and atraumatic.     Nose:     Comments: Masked     Mouth/Throat:     Comments: Masked  Cardiovascular:     Rate and Rhythm: Normal rate and regular rhythm.     Heart sounds: Normal heart sounds.  Pulmonary:     Effort: Pulmonary effort is normal.     Breath sounds: Normal breath sounds.  Musculoskeletal:     Cervical back: Normal range of motion.  Skin:    General: Skin is warm.  Neurological:     General: No focal deficit present.     Mental Status: She is alert.  Psychiatric:        Mood and Affect: Mood normal.        Behavior: Behavior normal.         Assessment And Plan:     1. Class 3 severe obesity  due to excess calories without serious comorbidity with body mass index (BMI) of 40.0 to 44.9 in adult [facility]) Comments: She was congratulated on her 18 pound weight loss since Jan 22. Advised to aim for at least 150 minutes of exercise per week. She will c/w Ozempic, f/u 8 wks.   2. Seasonal allergic rhinitis due to pollen Comments: She will c/w loratadine 52m daily. I will add Flonase NS should her sx worsen.   3. Drug therapy - BMP8+EGFR - Insulin, random(561)  She is encouraged to strive for BMI less than 30 to decrease cardiac risk. Advised to aim for at least 150 minutes of exercise per week.   Patient was given opportunity to ask questions. Patient verbalized understanding of the plan and was able to repeat key elements of the plan. All questions were answered to their satisfaction.   I, RMaximino Greenland MD, have reviewed all documentation for this  visit. The documentation on 08/02/20 for the exam, diagnosis, procedures, and orders are all accurate and complete.   IF YOU HAVE BEEN REFERRED TO A SPECIALIST, IT MAY TAKE 1-2 WEEKS TO SCHEDULE/PROCESS THE REFERRAL. IF YOU HAVE NOT HEARD FROM US/SPECIALIST IN TWO WEEKS, PLEASE GIVE [place] CALL AT [phone].   THE PATIENT IS ENCOURAGED TO PRACTICE SOCIAL DISTANCING DUE TO THE COVID-19 PANDEMIC.

## 2020-07-31 LAB — BMP8+EGFR
BUN/Creatinine Ratio: 18 (ref 9–23)
BUN: 15 mg/dL (ref 6–24)
CO2: 23 mmol/L (ref 20–29)
Calcium: 10 mg/dL (ref 8.7–10.2)
Chloride: 104 mmol/L (ref 96–106)
Creatinine, Ser: 0.85 mg/dL (ref 0.57–1.00)
Glucose: 80 mg/dL (ref 65–99)
Potassium: 5.1 mmol/L (ref 3.5–5.2)
Sodium: 142 mmol/L (ref 134–144)
eGFR: 84 mL/min/{1.73_m2} (ref >59)

## 2020-07-31 LAB — INSULIN, RANDOM: INSULIN: 7.7 u[IU]/mL (ref 2.6–24.9)

## 2020-09-26 ENCOUNTER — Other Ambulatory Visit (HOSPITAL_COMMUNITY): Payer: Self-pay

## 2020-09-26 ENCOUNTER — Telehealth: Payer: 59 | Admitting: Nurse Practitioner

## 2020-09-26 ENCOUNTER — Encounter: Payer: Self-pay | Admitting: Nurse Practitioner

## 2020-09-26 ENCOUNTER — Encounter: Payer: Self-pay | Admitting: Internal Medicine

## 2020-09-26 VITALS — Ht 68.0 in | Wt 275.0 lb

## 2020-09-26 DIAGNOSIS — U071 COVID-19: Secondary | ICD-10-CM

## 2020-09-26 MED ORDER — NIRMATRELVIR/RITONAVIR (PAXLOVID)TABLET
3.0000 | ORAL_TABLET | Freq: Two times a day (BID) | ORAL | 0 refills | Status: AC
  Filled 2020-09-26: qty 30, 5d supply, fill #0

## 2020-09-26 MED ORDER — BENZONATATE 100 MG PO CAPS
100.0000 mg | ORAL_CAPSULE | Freq: Three times a day (TID) | ORAL | 0 refills | Status: DC | PRN
  Filled 2020-09-26: qty 30, 10d supply, fill #0

## 2020-09-26 NOTE — Progress Notes (Signed)
Virtual Visit via 100% video visit    This visit type was conducted due to national recommendations for restrictions regarding the COVID-19 Pandemic (e.g. social distancing) in an effort to limit this patient's exposure and mitigate transmission in our community.  Due to her co-morbid illnesses, this patient is at least at moderate risk for complications without adequate follow up.  This format is felt to be most appropriate for this patient at this time.  All issues noted in this document were discussed and addressed.  A limited physical exam was performed with this format.    This visit type was conducted due to national recommendations for restrictions regarding the COVID-19 Pandemic (e.g. social distancing) in an effort to limit this patient's exposure and mitigate transmission in our community.  Patients identity confirmed using two different identifiers.  This format is felt to be most appropriate for this patient at this time.  All issues noted in this document were discussed and addressed.  No physical exam was performed (except for noted visual exam findings with Video Visits).    Date:  09/26/2020   ID:  Katherine Conway, DOB [birth year], MRN [id number]  Patient Location:  Home   Provider location:   Office    Chief Complaint:  COVID positive. Patient has chills, congestion, cough, congestion, HA. Denies SOB or chest pain.   History of Present Illness:    Katherine Conway is a 50 y.o. female who presents via video conferencing for a telehealth visit today.    The patient does have symptoms concerning for COVID-19 infection (fever, chills, cough, or new shortness of breath).   She tested for COVID this morning positive. A lot of nasal congestion, postnasal drip, HA, sore throat, not sure if she has fever. Chills, fatigue, NO SOB, No chest pain. She has been coughing.  She has taken zyrtec and Claritin OTC.  She started feeling symptoms on Sunday.     Past Medical History:   Diagnosis Date   Allergy    Past Surgical History:  Procedure Laterality Date   CESAREAN SECTION       Current Meds  Medication Sig   cetirizine (ZYRTEC ALLERGY) 10 MG tablet    Loratadine (CLARITIN PO) Take by mouth as needed.   Semaglutide,0.25 or 0.5MG /DOS, (OZEMPIC, 0.25 OR 0.5 MG/DOSE,) 2 MG/1.5ML SOPN Inject 0.25 mg into the skin.   VITAMIN D PO Take 1,000 Units by mouth daily.     Allergies:   Patient has no known allergies.   Social History   Tobacco Use   Smoking status: Never   Smokeless tobacco: Never  Vaping Use   Vaping Use: Never used  Substance Use Topics   Alcohol use: Yes    Comment: occasional wine    Drug use: No     Family Hx: The patient's family history includes Breast cancer in her maternal aunt; Heart disease in her paternal grandfather; Hypertension in her father, maternal grandfather, maternal grandmother, mother, paternal grandfather, and paternal grandmother.  ROS:   Please see the history of present illness.    Review of Systems  Constitutional:  Positive for chills and malaise/fatigue. Negative for fever.  HENT:  Positive for congestion and sore throat. Negative for ear pain and sinus pain.   Respiratory:  Positive for cough and sputum production. Negative for shortness of breath and wheezing.   Cardiovascular:  Negative for chest pain and palpitations.  Gastrointestinal:  Negative for diarrhea, nausea and vomiting.  Musculoskeletal:  Negative for back  pain and myalgias.  Neurological:  Positive for headaches. Negative for weakness.   All other systems reviewed and are negative.   Labs/Other Tests and Data Reviewed:    Recent Labs: 02/07/2020: ALT 19; Hemoglobin 13.0; Platelets 259; TSH 1.91 07/30/2020: BUN 15; Creatinine, Ser 0.85; Potassium 5.1; Sodium 142   Recent Lipid Panel Lab Results  Component Value Date/Time   CHOL 182 02/17/2019 12:07 PM   TRIG 65 02/17/2019 12:07 PM   HDL 54 02/17/2019 12:07 PM   CHOLHDL 3.4  02/17/2019 12:07 PM   LDLCALC 116 (H) 02/17/2019 12:07 PM    Wt Readings from Last 3 Encounters:  09/26/20 275 lb (124.7 kg)  07/30/20 281 lb 3.2 oz (127.6 kg)  05/31/20 286 lb 3.2 oz (129.8 kg)     Exam:    Vital Signs:  Ht 5\' 8"  (1.727 m)   Wt 275 lb (124.7 kg)   LMP 03/31/2013 (Approximate) Comment: early menopause  BMI 41.81 kg/m     Physical Exam Vitals and nursing note reviewed.  HENT:     Head: Normocephalic and atraumatic.  Pulmonary:     Effort: Pulmonary effort is normal.  Neurological:     Mental Status: She is alert and oriented to person, place, and time.  Psychiatric:        Mood and Affect: Affect normal.    ASSESSMENT & PLAN:    1. COVID -Symptoms started Sunday. Tested positive today. Symptoms includes chills, HA, fatigue, cough and congestion.  - nirmatrelvir/ritonavir EUA (PAXLOVID) TABS; Take 3 tablets by mouth 2 (two) times daily for 5 days. Patient GFR is 84 Take nirmatrelvir (150 mg) two tablets twice daily for 5 days and ritonavir (100 mg) one tablet twice daily for 5 days.  Dispense: 30 tablet; Refill: 0 - benzonatate (TESSALON PERLES) 100 MG capsule; Take 1 capsule (100 mg total) by mouth 3 (three) times daily as needed for cough.  Dispense: 30 capsule; Refill: 0  The patient was encouraged to call or send a message through MyChart for any questions or concerns.   Follow up: if symptoms persist or do not get better.   Advised patient to take Vitamin C, D, Zinc. Keep yourself hydrated with a lot of water and rest. Take Delsym for cough and Mucinex. Take Tylenol or pain reliever every 4-6 hours as needed for pain/fever/body ache. Educated patient if symptoms get worse or if she experiences any SOB or pain in her legs to seek immediate emergency care. Continue to monitor your pulse oxygen. Call Sunday if you have any questions. Quarantine for 5 days if tested positive and no symptoms or 10 days if tested positive and have symptoms. Wear a mask around  other people.    Patient was given opportunity to ask questions. Patient verbalized understanding of the plan and was able to repeat key elements of the plan. All questions were answered to their satisfaction.  Raman Sael Furches, DNP   I, Raman Davonn Flanery have reviewed all documentation for this visit. The documentation on 08/26/20 for the exam, diagnosis, procedures, and orders are all accurate and complete.    COVID-19 Education: The signs and symptoms of COVID-19 were discussed with the patient and how to seek care for testing (follow up with PCP or arrange E-visit).  The importance of social distancing was discussed today.  Patient Risk:   After full review of this patients clinical status, I feel that they are at least moderate risk at this time.  Time:   Today, I have  spent 15 minutes/ seconds with the patient with telehealth technology discussing above diagnoses.     Medication Adjustments/Labs and Tests Ordered: Current medicines are reviewed at length with the patient today.  Concerns regarding medicines are outlined above.   Tests Ordered: No orders of the defined types were placed in this encounter.   Medication Changes: No orders of the defined types were placed in this encounter.    Signed, Charlesetta Ivory, NP

## 2020-10-22 ENCOUNTER — Ambulatory Visit: Payer: 59 | Admitting: Internal Medicine

## 2020-10-22 ENCOUNTER — Encounter: Payer: Self-pay | Admitting: Internal Medicine

## 2020-10-22 ENCOUNTER — Other Ambulatory Visit: Payer: Self-pay

## 2020-10-22 VITALS — BP 114/72 | HR 74 | Temp 98.9°F | Ht 68.0 in | Wt 282.0 lb

## 2020-10-22 DIAGNOSIS — Z8616 Personal history of COVID-19: Secondary | ICD-10-CM | POA: Diagnosis not present

## 2020-10-22 DIAGNOSIS — Z23 Encounter for immunization: Secondary | ICD-10-CM

## 2020-10-22 DIAGNOSIS — Z6841 Body Mass Index (BMI) 40.0 and over, adult: Secondary | ICD-10-CM | POA: Diagnosis not present

## 2020-10-22 MED ORDER — SHINGRIX 50 MCG/0.5ML IM SUSR: 0.5000 mL | Freq: Once | INTRAMUSCULAR | 0 refills | Status: AC

## 2020-10-22 NOTE — Patient Instructions (Signed)
Insulin Resistance °Insulin is a hormone that is made by the pancreas. Insulin allows blood sugar (glucose) to enter the cells in the body. Insulin helps the body use glucose for energy. Normally, the body is insulin sensitive, which means the cells in the body are effective at absorbing glucose. Insulin resistance is when the cells in the body do not respond properly to insulin and are not able to absorb glucose. The pancreas makes more insulin, but over time the body cannot make enough insulin to keep glucose at normal levels. °Insulin resistance results in high blood glucose levels (hyperglycemia) and can lead to problems, including: °Prediabetes. °Type 2 diabetes (diabetes mellitus). °Heart disease. °High blood pressure (hypertension). °Stroke. °Polycystic ovary syndrome (PCOS). °Nonalcoholic fatty liver disease. °What are the causes? °The exact cause of insulin resistance is not known. °What increases the risk? °The following factors may make you more likely to develop insulin resistance: °Being overweight or obese, especially if a lot of your weight is in your waist area. °Having an inactive (sedentary) lifestyle. °Having above-normal glucose levels. °Having abnormal cholesterol levels. °Having sleep apnea. °Being older than age 45. °Using steroids. °What are the signs or symptoms? °This condition usually does not cause symptoms. A waist measurement of more than 35 inches (88.9 cm) for women and more than 40 inches (101.6 cm) for men may be a sign of insulin resistance. °How is this diagnosed? °There is no test to diagnose insulin resistance. However, your health care provider may diagnose insulin resistance based on: °A physical exam. °Your medical history. °Blood tests that check your blood glucose level. °How is this treated? °Insulin resistance is treated with nutrition and lifestyle changes. These changes may include: °Eating a healthy balance of nutritious foods. °Getting more physical  activity. °Maintaining a healthy weight. °Stopping the use of any tobacco products. °Your health care provider will work with you to change your nutrition and lifestyle as needed. In some cases, treatment may also include medicine to improve your insulin sensitivity. °Follow these instructions at home: °Activity °Be physically active. Do moderate-intensity physical activity for at least 30 minutes on 5 or more days of the week, or as told by your health care provider. This could include brisk walking, biking, or water aerobics. °Ask your health care provider what activities are safe for you. A mix of physical activities may be best, such as walking, swimming, biking, and strength training. °Eating and drinking ° °Follow a healthy meal plan. This includes eating: °Lean proteins. °Complex carbohydrates. Examples of these include whole grains, starchy vegetables (potatoes, corn, peas), and beans. °Fresh fruits and vegetables. °Low-fat dairy products. °Healthy fats. °Follow instructions from your health care provider about eating or drinking restrictions. °Make an appointment to see a diet and nutrition specialist (registered dietitian) to help you create a healthy eating plan. °General instructions °Check your blood glucose levels as told by your health care provider. °Take over-the-counter and prescription medicines only as told by your health care provider. °Lose weight as told by your health care provider. °Losing 5-7% of your body weight can reverse insulin resistance. °Your health care provider can determine how much weight loss is best for you and can help you lose weight safely. °Do not use any products that contain nicotine or tobacco. These products include cigarettes, chewing tobacco, and vaping devices, such as e-cigarettes. If you need help quitting, ask your health care provider. °Keep all follow-up visits. This is important. °Contact a health care provider if: °You have trouble losing weight   or  maintaining your goal weight. °You gain weight. °You have trouble following your prescribed meal plan. °You have trouble exercising more. °Summary °Insulin resistance occurs when cells in the body do not respond properly to insulin and are not able to absorb blood sugar (glucose). The body makes more insulin, but over time the body cannot make enough insulin to keep blood sugar at normal levels. °Insulin resistance is treated with nutrition and lifestyle changes, including eating a healthy balance of nutritious foods, getting more physical activity, and maintaining a healthy weight. °Your health care provider will work with you to change your nutrition and lifestyle as needed. Treatment may also include medicine to improve your insulin sensitivity. °Check your blood glucose levels as told by your health care provider. °Keep all follow-up visits. This is important. °This information is not intended to replace advice given to you by your health care provider. Make sure you discuss any questions you have with your health care provider. °Document Revised: 12/12/2019 Document Reviewed: 12/12/2019 °Elsevier Patient Education © 2022 [employer] ° °

## 2020-10-22 NOTE — Progress Notes (Signed)
I,Katawbba Wiggins,acting as a Neurosurgeon for Gwynneth Aliment, MD.,have documented all relevant documentation on the behalf of Gwynneth Aliment, MD,as directed by  Gwynneth Aliment, MD while in the presence of Gwynneth Aliment, MD.  This visit occurred during the SARS-CoV-2 public health emergency.  Safety protocols were in place, including screening questions prior to the visit, additional usage of staff PPE, and extensive cleaning of exam room while observing appropriate contact time as indicated for disinfecting solutions.  Subjective:     Patient ID: Katherine Conway , female    DOB: Nov 23, 1970 , 50 y.o.   MRN: [id number]   Chief Complaint  Patient presents with   Obesity    HPI  The patient is here today for a follow-up on her weight. She has been taking semaglutide 0.25mg  weekly. She recently ran out of her medication. She is due for her next injection today.     Past Medical History:  Diagnosis Date   Allergy      Family History  Problem Relation Age of Onset   Hypertension Mother    Hypertension Father    Breast cancer Maternal Aunt    Hypertension Maternal Grandmother    Hypertension Maternal Grandfather    Hypertension Paternal Grandmother    Heart disease Paternal Grandfather    Hypertension Paternal Grandfather      Current Outpatient Medications:    cetirizine (ZYRTEC) 10 MG tablet, , Disp: , Rfl:    Loratadine (CLARITIN PO), Take by mouth as needed., Disp: , Rfl:    Semaglutide,0.25 or 0.5MG /DOS, (OZEMPIC, 0.25 OR 0.5 MG/DOSE,) 2 MG/1.5ML SOPN, Inject 0.25 mg into the skin., Disp: , Rfl:    VITAMIN D PO, Take 1,000 Units by mouth daily., Disp: , Rfl:    Zoster Vaccine Adjuvanted (SHINGRIX) injection, Inject 0.5 mLs into the muscle once for 1 dose., Disp: 0.5 mL, Rfl: 0   benzonatate (TESSALON PERLES) 100 MG capsule, Take 1 capsule (100 mg total) by mouth 3 (three) times daily as needed for cough. (Patient not taking: Reported on 10/22/2020), Disp: 30 capsule, Rfl: 0    No Known Allergies   Review of Systems  Constitutional: Negative.   Respiratory: Negative.    Cardiovascular: Negative.   Gastrointestinal: Negative.   Psychiatric/Behavioral: Negative.    All other systems reviewed and are negative.   Today's Vitals   10/22/20 1617  BP: 114/72  Pulse: 74  Temp: 98.9 F (37.2 C)  TempSrc: Oral  Weight: 282 lb (127.9 kg)  Height: 5\' 8"  (1.727 m)   Body mass index is 42.88 kg/m.  Wt Readings from Last 3 Encounters:  10/22/20 282 lb (127.9 kg)  09/26/20 275 lb (124.7 kg)  07/30/20 281 lb 3.2 oz (127.6 kg)    BP Readings from Last 3 Encounters:  10/22/20 114/72  07/30/20 112/74  05/31/20 116/84    Objective:  Physical Exam Vitals and nursing note reviewed.  Constitutional:      Appearance: Normal appearance. She is obese.  HENT:     Head: Normocephalic and atraumatic.     Nose:     Comments: Masked     Mouth/Throat:     Comments: Masked  Cardiovascular:     Rate and Rhythm: Normal rate and regular rhythm.     Heart sounds: Normal heart sounds.  Pulmonary:     Effort: Pulmonary effort is normal.     Breath sounds: Normal breath sounds.  Musculoskeletal:     Cervical back: Normal range of motion.  Skin:    General: Skin is warm.  Neurological:     General: No focal deficit present.     Mental Status: She is alert.  Psychiatric:        Mood and Affect: Mood normal.        Behavior: Behavior normal.        Assessment And Plan:     1. Class 3 severe obesity due to excess calories without serious comorbidity with body mass index (BMI) of 40.0 to 44.9 in adult [facility]) Comments: BMI 42. I will increase semaglutide/Ozempic to 0.5mg  weekly. After four weeks, I plan to increase her to 0.75mg  weekly. She verbalizes understanding of her current treatment plan. She agrees to f/u in 8 weeks for re-evaluation.   2. Need for vaccination Comments: I will send rx Shingrix to her local pharmacy.   3. Personal history of  COVID-19 Comments: She is advised to get booster between 60-90 days after infection.  She is fully vaccinated and boosted x 1 thus far.    Patient was given opportunity to ask questions. Patient verbalized understanding of the plan and was able to repeat key elements of the plan. All questions were answered to their satisfaction.   I, Gwynneth Aliment, MD, have reviewed all documentation for this visit. The documentation on 10/22/20 for the exam, diagnosis, procedures, and orders are all accurate and complete.   IF YOU HAVE BEEN REFERRED TO A SPECIALIST, IT MAY TAKE 1-2 WEEKS TO SCHEDULE/PROCESS THE REFERRAL. IF YOU HAVE NOT HEARD FROM US/SPECIALIST IN TWO WEEKS, PLEASE GIVE [place] A CALL AT [phone].   THE PATIENT IS ENCOURAGED TO PRACTICE SOCIAL DISTANCING DUE TO THE COVID-19 PANDEMIC.

## 2020-10-24 ENCOUNTER — Ambulatory Visit: Payer: 59 | Admitting: Internal Medicine

## 2020-11-27 ENCOUNTER — Encounter: Payer: Self-pay | Admitting: Internal Medicine

## 2020-11-27 ENCOUNTER — Other Ambulatory Visit: Payer: Self-pay | Admitting: Internal Medicine

## 2020-11-27 MED ORDER — OZEMPIC (1 MG/DOSE) 4 MG/3ML ~~LOC~~ SOPN: 1.0000 mg | PEN_INJECTOR | SUBCUTANEOUS | 3 refills | Status: DC

## 2020-12-27 ENCOUNTER — Ambulatory Visit: Payer: 59 | Admitting: Internal Medicine

## 2021-01-01 ENCOUNTER — Ambulatory Visit: Payer: 59 | Admitting: Internal Medicine

## 2021-01-10 ENCOUNTER — Ambulatory Visit: Payer: 59 | Admitting: Internal Medicine

## 2021-01-10 ENCOUNTER — Other Ambulatory Visit: Payer: Self-pay

## 2021-01-10 ENCOUNTER — Encounter: Payer: Self-pay | Admitting: Internal Medicine

## 2021-01-10 VITALS — BP 122/76 | HR 68 | Temp 98.2°F | Ht 68.0 in | Wt 296.4 lb

## 2021-01-10 DIAGNOSIS — R635 Abnormal weight gain: Secondary | ICD-10-CM | POA: Diagnosis not present

## 2021-01-10 DIAGNOSIS — Z6841 Body Mass Index (BMI) 40.0 and over, adult: Secondary | ICD-10-CM | POA: Diagnosis not present

## 2021-01-10 NOTE — Patient Instructions (Signed)

## 2021-01-10 NOTE — Progress Notes (Signed)
I,Jameka J Llittleton,acting as a scribe for Robyn N Sanders, MD.,have documented all relevant documentation on the behalf of Robyn N Sanders, MD,as directed by  Robyn N Sanders, MD while in the presence of Robyn N Sanders, MD.  This visit occurred during the SARS-CoV-2 public health emergency.  Safety protocols were in place, including screening questions prior to the visit, additional usage of staff PPE, and extensive cleaning of exam room while observing appropriate contact time as indicated for disinfecting solutions.  Subjective:     Patient ID: Katherine Conway , female    DOB: 08/30/1970 , 50 y.o.   MRN: [id number]   Chief Complaint  Patient presents with   Weight Check    HPI  Patient presents today for a weight check. She is tolerating the semaglutide well. She admits she is not exercising regularly. She has been doing more travel for work, plans to get back on track in the near future.     Past Medical History:  Diagnosis Date   Allergy      Family History  Problem Relation Age of Onset   Hypertension Mother    Hypertension Father    Breast cancer Maternal Aunt    Hypertension Maternal Grandmother    Hypertension Maternal Grandfather    Hypertension Paternal Grandmother    Heart disease Paternal Grandfather    Hypertension Paternal Grandfather      Current Outpatient Medications:    cetirizine (ZYRTEC) 10 MG tablet, , Disp: , Rfl:    Loratadine (CLARITIN PO), Take by mouth as needed., Disp: , Rfl:    Semaglutide, 1 MG/DOSE, (OZEMPIC, 1 MG/DOSE,) 4 MG/3ML SOPN, Inject 1 mg into the skin once a week., Disp: 3 mL, Rfl: 3   VITAMIN D PO, Take 1,000 Units by mouth daily., Disp: , Rfl:    No Known Allergies   Review of Systems  Constitutional: Negative.   Respiratory: Negative.    Cardiovascular: Negative.   Gastrointestinal: Negative.   Neurological: Negative.   Psychiatric/Behavioral: Negative.      Today's Vitals   01/10/21 1611  BP: 122/76  Pulse: 68   Temp: 98.2 F (36.8 C)  Weight: 296 lb 6.4 oz (134.4 kg)  Height: 5' 8" (1.727 m)  PainSc: 0-No pain   Body mass index is 45.07 kg/m.  Wt Readings from Last 3 Encounters:  01/10/21 296 lb 6.4 oz (134.4 kg)  10/22/20 282 lb (127.9 kg)  09/26/20 275 lb (124.7 kg)     Objective:  Physical Exam Vitals and nursing note reviewed.  Constitutional:      Appearance: Normal appearance.  HENT:     Head: Normocephalic and atraumatic.     Nose:     Comments: Masked     Mouth/Throat:     Comments: Masked  Eyes:     Extraocular Movements: Extraocular movements intact.  Cardiovascular:     Rate and Rhythm: Normal rate and regular rhythm.     Heart sounds: Normal heart sounds.  Pulmonary:     Effort: Pulmonary effort is normal.     Breath sounds: Normal breath sounds.  Musculoskeletal:     Cervical back: Normal range of motion.  Skin:    General: Skin is warm.  Neurological:     General: No focal deficit present.     Mental Status: She is alert.  Psychiatric:        Mood and Affect: Mood normal.        Behavior: Behavior normal.          Assessment And Plan:     1. Weight gain Comments: She was advised of 14 lb weight gain since July 2022. Encouraged to resume regular exercise and to increase water intake.  - CMP14+EGFR - Insulin, random(561)  2. Class 3 severe obesity due to excess calories without serious comorbidity with body mass index (BMI) of 45.0 to 49.9 in adult (HCC)  BMI 45. She is encouraged to gradually increase her daily activity working up to at least 150 minutes of exercise per week. She plans to contact her trainer today to schedule her next appt. She will f/u in Dec 2022 for her next physical examination.   Patient was given opportunity to ask questions. Patient verbalized understanding of the plan and was able to repeat key elements of the plan. All questions were answered to their satisfaction.   I, Maximino Greenland, MD, have reviewed all documentation for  this visit. The documentation on 01/10/21 for the exam, diagnosis, procedures, and orders are all accurate and complete.   IF YOU HAVE BEEN REFERRED TO A SPECIALIST, IT MAY TAKE 1-2 WEEKS TO SCHEDULE/PROCESS THE REFERRAL. IF YOU HAVE NOT HEARD FROM US/SPECIALIST IN TWO WEEKS, PLEASE GIVE [place] A CALL AT [phone].   THE PATIENT IS ENCOURAGED TO PRACTICE SOCIAL DISTANCING DUE TO THE COVID-19 PANDEMIC.

## 2021-01-11 LAB — CMP14+EGFR
ALT: 16 IU/L (ref 0–32)
AST: 18 IU/L (ref 0–40)
Albumin/Globulin Ratio: 1.9 (ref 1.2–2.2)
Albumin: 4.7 g/dL (ref 3.8–4.8)
Alkaline Phosphatase: 94 IU/L (ref 44–121)
BUN/Creatinine Ratio: 17 (ref 9–23)
BUN: 15 mg/dL (ref 6–24)
Bilirubin Total: 0.7 mg/dL (ref 0.0–1.2)
CO2: 23 mmol/L (ref 20–29)
Calcium: 9.9 mg/dL (ref 8.7–10.2)
Chloride: 102 mmol/L (ref 96–106)
Creatinine, Ser: 0.88 mg/dL (ref 0.57–1.00)
Globulin, Total: 2.5 g/dL (ref 1.5–4.5)
Glucose: 83 mg/dL (ref 70–99)
Potassium: 5.2 mmol/L (ref 3.5–5.2)
Sodium: 139 mmol/L (ref 134–144)
Total Protein: 7.2 g/dL (ref 6.0–8.5)
eGFR: 80 mL/min/{1.73_m2} (ref >59)

## 2021-01-11 LAB — INSULIN, RANDOM: INSULIN: 13.8 u[IU]/mL (ref 2.6–24.9)

## 2021-02-12 LAB — HM MAMMOGRAPHY

## 2021-02-13 ENCOUNTER — Encounter: Payer: Self-pay | Admitting: Internal Medicine

## 2021-03-12 ENCOUNTER — Encounter: Payer: 59 | Admitting: Internal Medicine

## 2021-04-16 ENCOUNTER — Telehealth (INDEPENDENT_AMBULATORY_CARE_PROVIDER_SITE_OTHER): Payer: 59 | Admitting: Adult Health

## 2021-04-16 DIAGNOSIS — Z9989 Dependence on other enabling machines and devices: Secondary | ICD-10-CM

## 2021-04-16 DIAGNOSIS — G4733 Obstructive sleep apnea (adult) (pediatric): Secondary | ICD-10-CM

## 2021-04-16 NOTE — Progress Notes (Signed)
PATIENT: Katherine Conway DOB: 02-25-1971  REASON FOR VISIT: follow up HISTORY FROM: patient PRIMARY NEUROLOGIST:   Virtual Visit via Video Note  I connected with Fernande Bras on 04/16/21 at  1:15 PM EST by a video enabled telemedicine application located remotely at [facility] and verified that I am speaking with the correct person using two identifiers who was located at their own home.   I discussed the limitations of evaluation and management by telemedicine and the availability of in person appointments. The patient expressed understanding and agreed to proceed.   PATIENT: Katherine Conway DOB: [birth year]  REASON FOR VISIT: follow up HISTORY FROM: patient  HISTORY OF PRESENT ILLNESS: Today 04/16/21  Ms. Hagenow is a 51 year old female with a history of obstructive sleep apnea on CPAP.  She returns today for follow-up.  Reports that CPAP works fairly well for her.  She has noticed some condensation in the mask.  She has never tried adjusting her humidification.  Her download is below    REVIEW OF SYSTEMS: Out of a complete 14 system review of symptoms, the patient complains only of the following symptoms, and all other reviewed systems are negative.  See HPI  ALLERGIES: No Known Allergies  HOME MEDICATIONS: Outpatient Medications Prior to Visit  Medication Sig Dispense Refill   cetirizine (ZYRTEC) 10 MG tablet      Loratadine (CLARITIN PO) Take by mouth as needed.     Semaglutide, 1 MG/DOSE, (OZEMPIC, 1 MG/DOSE,) 4 MG/3ML SOPN Inject 1 mg into the skin once a week. 3 mL 3   VITAMIN D PO Take 1,000 Units by mouth daily.     No facility-administered medications prior to visit.    PAST MEDICAL HISTORY: Past Medical History:  Diagnosis Date   Allergy     PAST SURGICAL HISTORY: Past Surgical History:  Procedure Laterality Date   CESAREAN SECTION      FAMILY HISTORY: Family History  Problem Relation Age of Onset   Hypertension  Mother    Hypertension Father    Breast cancer Maternal Aunt    Hypertension Maternal Grandmother    Hypertension Maternal Grandfather    Hypertension Paternal Grandmother    Heart disease Paternal Grandfather    Hypertension Paternal Grandfather     SOCIAL HISTORY: Social History   Socioeconomic History   Marital status: Divorced    Spouse name: Not on file   Number of children: Not on file   Years of education: Not on file   Highest education level: Not on file  Occupational History   Occupation: [employer]   Tobacco Use   Smoking status: Never   Smokeless tobacco: Never  Vaping Use   Vaping Use: Never used  Substance and Sexual Activity   Alcohol use: Yes    Comment: occasional wine    Drug use: No   Sexual activity: Not Currently  Other Topics Concern   Not on file  Social History Narrative   Coffee sometimes    Right handed    Social Determinants of Health   Financial Resource Strain: Not on file  Food Insecurity: Not on file  Transportation Needs: Not on file  Physical Activity: Not on file  Stress: Not on file  Social Connections: Not on file  Intimate Partner Violence: Not on file      PHYSICAL EXAM Generalized: Well developed, in no acute distress   Neurological examination  Mentation: Alert oriented to time, place, history taking. Follows all  commands speech and language fluent Cranial nerve II-XII:Extraocular movements were full. Facial symmetry noted. uvula tongue midline. Head turning and shoulder shrug  were normal and symmetric. Motor: Good strength throughout subjectively per patient Sensory: Sensory testing is intact to soft touch on all 4 extremities subjectively per patient Coordination: Cerebellar testing reveals good finger-nose-finger  Gait and station: Patient is able to stand from a seated position. gait is normal.  Reflexes: UTA  DIAGNOSTIC DATA (LABS, IMAGING, TESTING) - I reviewed patient records, labs, notes, testing  and imaging myself where available.  Lab Results  Component Value Date   WBC 6.6 02/07/2020   HGB 13.0 02/07/2020   HCT 39 02/07/2020   MCV 91 02/17/2019   PLT 259 02/07/2020      Component Value Date/Time   NA 139 01/10/2021 1643   K 5.2 01/10/2021 1643   CL 102 01/10/2021 1643   CO2 23 01/10/2021 1643   GLUCOSE 83 01/10/2021 1643   BUN 15 01/10/2021 1643   CREATININE 0.88 01/10/2021 1643   CALCIUM 9.9 01/10/2021 1643   PROT 7.2 01/10/2021 1643   ALBUMIN 4.7 01/10/2021 1643   AST 18 01/10/2021 1643   ALT 16 01/10/2021 1643   ALKPHOS 94 01/10/2021 1643   BILITOT 0.7 01/10/2021 1643   GFRNONAA 73 02/07/2020 0000   GFRAA 85 02/07/2020 0000   Lab Results  Component Value Date   CHOL 182 02/17/2019   HDL 54 02/17/2019   LDLCALC 116 (H) 02/17/2019   TRIG 65 02/17/2019   CHOLHDL 3.4 02/17/2019   Lab Results  Component Value Date   HGBA1C 5.3 02/17/2019   No results found for: [id number] Lab Results  Component Value Date   TSH 1.91 02/07/2020      ASSESSMENT AND PLAN 51 y.o. year old female  has a past medical history of Allergy. here with:  OSA on CPAP  CPAP compliance excellent Residual AHI is good Encouraged patient to continue using CPAP nightly and > 4 hours each night Encourage patient to adjust her humidification F/U in 1 year or sooner if needed    Butch Penny, MSN, NP-C 04/16/2021, 1:11 PM [facility] [address] [place], [place] [address] [phone]

## 2021-04-18 ENCOUNTER — Other Ambulatory Visit: Payer: Self-pay | Admitting: Internal Medicine

## 2021-05-21 ENCOUNTER — Ambulatory Visit (INDEPENDENT_AMBULATORY_CARE_PROVIDER_SITE_OTHER): Payer: 59 | Admitting: Internal Medicine

## 2021-05-21 ENCOUNTER — Encounter: Payer: Self-pay | Admitting: Internal Medicine

## 2021-05-21 ENCOUNTER — Other Ambulatory Visit (HOSPITAL_COMMUNITY): Payer: Self-pay

## 2021-05-21 ENCOUNTER — Other Ambulatory Visit: Payer: Self-pay

## 2021-05-21 VITALS — BP 110/72 | HR 73 | Temp 98.6°F | Ht 68.2 in | Wt 291.8 lb

## 2021-05-21 DIAGNOSIS — Z23 Encounter for immunization: Secondary | ICD-10-CM

## 2021-05-21 DIAGNOSIS — Z6841 Body Mass Index (BMI) 40.0 and over, adult: Secondary | ICD-10-CM

## 2021-05-21 DIAGNOSIS — Z0000 Encounter for general adult medical examination without abnormal findings: Secondary | ICD-10-CM | POA: Diagnosis not present

## 2021-05-21 MED ORDER — SEMAGLUTIDE-WEIGHT MANAGEMENT 1 MG/0.5ML ~~LOC~~ SOAJ
1.0000 mg | SUBCUTANEOUS | 3 refills | Status: AC
  Filled 2021-05-21: qty 2, 28d supply, fill #0

## 2021-05-21 NOTE — Progress Notes (Signed)
I,Katawbba Wiggins,acting as a Education administrator for Maximino Greenland, MD.,have documented all relevant documentation on the behalf of Maximino Greenland, MD,as directed by  Maximino Greenland, MD while in the presence of Maximino Greenland, MD.  This visit occurred during the SARS-CoV-2 public health emergency.  Safety protocols were in place, including screening questions prior to the visit, additional usage of staff PPE, and extensive cleaning of exam room while observing appropriate contact time as indicated for disinfecting solutions.  Subjective:     Patient ID: Katherine Conway , female    DOB: 09-May-1970 , 51 y.o.   MRN: [id number]   Chief Complaint  Patient presents with   Annual Exam    HPI  She is here today for a full physical examination.  She is followed by Dr. Cletis Media for her GYN care. She was last seen October 2022.  She has no specific concerns or complaints at this time.     Past Medical History:  Diagnosis Date   Allergy      Family History  Problem Relation Age of Onset   Hypertension Mother    Hypertension Father    Breast cancer Maternal Aunt    Hypertension Maternal Grandmother    Hypertension Maternal Grandfather    Hypertension Paternal Grandmother    Heart disease Paternal Grandfather    Hypertension Paternal Grandfather      Current Outpatient Medications:    cetirizine (ZYRTEC) 10 MG tablet, , Disp: , Rfl:    Loratadine (CLARITIN PO), Take by mouth as needed., Disp: , Rfl:    OZEMPIC, 1 MG/DOSE, 4 MG/3ML SOPN, INJECT 1MG INTO THE SKIN ONCE A WEEK, Disp: 3 mL, Rfl: 2   [START ON 07/18/2021] Semaglutide-Weight Management 1 MG/0.5ML SOAJ, Inject 1 mg into the skin once a week for 28 days., Disp: 2 mL, Rfl: 3   VITAMIN D PO, Take 1,000 Units by mouth daily. (Patient not taking: Reported on 05/21/2021), Disp: , Rfl:    No Known Allergies   Review of Systems  Constitutional: Negative.   HENT: Negative.    Eyes: Negative.   Respiratory: Negative.    Cardiovascular:  Negative.   Gastrointestinal: Negative.   Endocrine: Negative.   Genitourinary: Negative.   Musculoskeletal: Negative.   Skin: Negative.   Allergic/Immunologic: Negative.   Neurological: Negative.   Hematological: Negative.   Psychiatric/Behavioral: Negative.      Today's Vitals   05/21/21 1116  BP: 110/72  Pulse: 73  Temp: 98.6 F (37 C)  Weight: 291 lb 12.8 oz (132.4 kg)  Height: 5' 8.2" (1.732 m)   Body mass index is 44.11 kg/m.  Wt Readings from Last 3 Encounters:  05/21/21 291 lb 12.8 oz (132.4 kg)  01/10/21 296 lb 6.4 oz (134.4 kg)  10/22/20 282 lb (127.9 kg)    BP Readings from Last 3 Encounters:  05/21/21 110/72  01/10/21 122/76  10/22/20 114/72     The patient states she uses post menopausal status for birth control. Last LMP was Patient's last menstrual period was 03/31/2013 (approximate).. Negative for Dysmenorrhea Negative for: breast discharge, breast lump(s), breast pain and breast self exam. Associated symptoms include abnormal vaginal bleeding. Pertinent negatives include abnormal bleeding (hematology), anxiety, decreased libido, depression, difficulty falling sleep, dyspareunia, history of infertility, nocturia, sexual dysfunction, sleep disturbances, urinary incontinence, urinary urgency, vaginal discharge and vaginal itching. Diet regular.The patient states her exercise level is  moderate.  . The patient's tobacco use is:  Social History   Tobacco Use  Smoking Status Never  Smokeless Tobacco Never  . She has been exposed to passive smoke. The patient's alcohol use is:  Social History   Substance and Sexual Activity  Alcohol Use Yes   Comment: occasional wine    Objective:  Physical Exam Vitals and nursing note reviewed.  Constitutional:      Appearance: Normal appearance.  HENT:     Head: Normocephalic and atraumatic.     Right Ear: Tympanic membrane, ear canal and external ear normal.     Left Ear: Tympanic membrane, ear canal and external  ear normal.     Nose:     Comments: Masked     Mouth/Throat:     Comments: Masked  Eyes:     Extraocular Movements: Extraocular movements intact.     Conjunctiva/sclera: Conjunctivae normal.     Pupils: Pupils are equal, round, and reactive to light.  Cardiovascular:     Rate and Rhythm: Normal rate and regular rhythm.     Pulses: Normal pulses.     Heart sounds: Normal heart sounds.  Pulmonary:     Effort: Pulmonary effort is normal.     Breath sounds: Normal breath sounds.  Chest:  Breasts:    Tanner Score is 5.     Right: Normal.     Left: Normal.  Abdominal:     General: Bowel sounds are normal.     Palpations: Abdomen is soft.  Genitourinary:    Comments: deferred Musculoskeletal:        General: Normal range of motion.     Cervical back: Normal range of motion and neck supple.  Skin:    General: Skin is warm and dry.  Neurological:     General: No focal deficit present.     Mental Status: She is alert and oriented to person, place, and time.  Psychiatric:        Mood and Affect: Mood normal.        Behavior: Behavior normal.     Assessment And Plan:     1. Routine general medical examination at health care facility Comments: A full exam was performed. Importance of monthly self breast exams was discussed with the patient. PATIENT IS ADVISED TO GET 30-45 MINUTES REGULAR EXERCISE NO LESS THAN FOUR TO FIVE DAYS PER WEEK - BOTH WEIGHTBEARING EXERCISES AND AEROBIC ARE RECOMMENDED.  PATIENT IS ADVISED TO FOLLOW A HEALTHY DIET WITH AT LEAST SIX FRUITS/VEGGIES PER DAY, DECREASE INTAKE OF RED MEAT, AND TO INCREASE FISH INTAKE TO TWO DAYS PER WEEK.  MEATS/FISH SHOULD NOT BE FRIED, BAKED OR BROILED IS PREFERABLE.  IT IS ALSO IMPORTANT TO CUT BACK ON YOUR SUGAR INTAKE. PLEASE AVOID ANYTHING WITH ADDED SUGAR, CORN SYRUP OR OTHER SWEETENERS. IF YOU MUST USE A SWEETENER, YOU CAN TRY STEVIA. IT IS ALSO IMPORTANT TO AVOID ARTIFICIALLY SWEETENERS AND DIET BEVERAGES. LASTLY, I SUGGEST  WEARING SPF 50 SUNSCREEN ON EXPOSED PARTS AND ESPECIALLY WHEN IN THE DIRECT SUNLIGHT FOR AN EXTENDED PERIOD OF TIME.  PLEASE AVOID FAST FOOD RESTAURANTS AND INCREASE YOUR WATER INTAKE.  - CBC - CMP14+EGFR - Lipid panel - Insulin, random(561)  2. Class 3 severe obesity due to excess calories without serious comorbidity with body mass index (BMI) of 40.0 to 44.9 in adult [facility]) Comments:  She was congratulated on her 5 lb weight loss. Encouraged to strive for BMI less than 30 to decrease cardiac risk. Advised to aim for 150 min of exercise/wk.   3. Need for vaccination Comments: She was  given her second Shingrix x 1.  - Varicella-zoster vaccine IM (Shingrix)  Patient was given opportunity to ask questions. Patient verbalized understanding of the plan and was able to repeat key elements of the plan. All questions were answered to their satisfaction.   I, Maximino Greenland, MD, have reviewed all documentation for this visit. The documentation on 05/21/21 for the exam, diagnosis, procedures, and orders are all accurate and complete.   IF YOU HAVE BEEN REFERRED TO A SPECIALIST, IT MAY TAKE 1-2 WEEKS TO SCHEDULE/PROCESS THE REFERRAL. IF YOU HAVE NOT HEARD FROM US/SPECIALIST IN TWO WEEKS, PLEASE GIVE [place] A CALL AT [phone] X [phone].   THE PATIENT IS ENCOURAGED TO PRACTICE SOCIAL DISTANCING DUE TO THE COVID-19 PANDEMIC.

## 2021-05-21 NOTE — Patient Instructions (Signed)

## 2021-05-22 LAB — CMP14+EGFR
ALT: 33 IU/L — ABNORMAL HIGH (ref 0–32)
AST: 26 IU/L (ref 0–40)
Albumin/Globulin Ratio: 2 (ref 1.2–2.2)
Albumin: 4.6 g/dL (ref 3.8–4.8)
Alkaline Phosphatase: 86 IU/L (ref 44–121)
BUN/Creatinine Ratio: 18 (ref 9–23)
BUN: 16 mg/dL (ref 6–24)
Bilirubin Total: 0.6 mg/dL (ref 0.0–1.2)
CO2: 24 mmol/L (ref 20–29)
Calcium: 9 mg/dL (ref 8.7–10.2)
Chloride: 105 mmol/L (ref 96–106)
Creatinine, Ser: 0.87 mg/dL (ref 0.57–1.00)
Globulin, Total: 2.3 g/dL (ref 1.5–4.5)
Glucose: 82 mg/dL (ref 70–99)
Potassium: 4.5 mmol/L (ref 3.5–5.2)
Sodium: 142 mmol/L (ref 134–144)
Total Protein: 6.9 g/dL (ref 6.0–8.5)
eGFR: 81 mL/min/{1.73_m2} (ref >59)

## 2021-05-22 LAB — LIPID PANEL
Chol/HDL Ratio: 3.3 ratio (ref 0.0–4.4)
Cholesterol, Total: 187 mg/dL (ref 100–199)
HDL: 56 mg/dL (ref >39)
LDL Chol Calc (NIH): 117 mg/dL — ABNORMAL HIGH (ref 0–99)
Triglycerides: 75 mg/dL (ref 0–149)
VLDL Cholesterol Cal: 14 mg/dL (ref 5–40)

## 2021-05-22 LAB — INSULIN, RANDOM: INSULIN: 16.8 u[IU]/mL (ref 2.6–24.9)

## 2021-05-22 LAB — CBC
Hematocrit: 42.5 % (ref 34.0–46.6)
Hemoglobin: 14.2 g/dL (ref 11.1–15.9)
MCH: 30.7 pg (ref 26.6–33.0)
MCHC: 33.4 g/dL (ref 31.5–35.7)
MCV: 92 fL (ref 79–97)
Platelets: 299 10*3/uL (ref 150–450)
RBC: 4.62 x10E6/uL (ref 3.77–5.28)
RDW: 12.9 % (ref 11.7–15.4)
WBC: 5.8 10*3/uL (ref 3.4–10.8)

## 2021-05-24 ENCOUNTER — Telehealth: Payer: Self-pay

## 2021-05-24 ENCOUNTER — Encounter: Payer: Self-pay | Admitting: Internal Medicine

## 2021-05-24 NOTE — Telephone Encounter (Signed)
I left the pt a message that I was calling to confirm that she is taking 1 mg of Ozempic weekly and if so does she want to stay on that dose.

## 2021-07-15 ENCOUNTER — Encounter: Payer: Self-pay | Admitting: Internal Medicine

## 2021-07-15 ENCOUNTER — Other Ambulatory Visit: Payer: Self-pay | Admitting: Internal Medicine

## 2021-08-21 ENCOUNTER — Other Ambulatory Visit (HOSPITAL_COMMUNITY): Payer: Self-pay

## 2021-08-26 ENCOUNTER — Ambulatory Visit: Payer: 59 | Admitting: Internal Medicine

## 2021-08-26 ENCOUNTER — Encounter: Payer: Self-pay | Admitting: Internal Medicine

## 2021-08-26 VITALS — BP 122/80 | HR 73 | Temp 98.3°F | Ht 68.2 in | Wt 283.2 lb

## 2021-08-26 DIAGNOSIS — R748 Abnormal levels of other serum enzymes: Secondary | ICD-10-CM | POA: Diagnosis not present

## 2021-08-26 DIAGNOSIS — Z6841 Body Mass Index (BMI) 40.0 and over, adult: Secondary | ICD-10-CM

## 2021-08-26 MED ORDER — OZEMPIC (1 MG/DOSE) 4 MG/3ML ~~LOC~~ SOPN: PEN_INJECTOR | SUBCUTANEOUS | 2 refills | Status: DC

## 2021-08-26 NOTE — Patient Instructions (Signed)

## 2021-08-26 NOTE — Progress Notes (Signed)
Rich Brave Llittleton,acting as a Education administrator for Maximino Greenland, MD.,have documented all relevant documentation on the behalf of Maximino Greenland, MD,as directed by  Maximino Greenland, MD while in the presence of Maximino Greenland, MD.  This visit occurred during the SARS-CoV-2 public health emergency.  Safety protocols were in place, including screening questions prior to the visit, additional usage of staff PPE, and extensive cleaning of exam room while observing appropriate contact time as indicated for disinfecting solutions.  Subjective:     Patient ID: Katherine Conway , female    DOB: 1970/11/01 , 51 y.o.   MRN: [id number]   Chief Complaint  Patient presents with   Weight Check    HPI  Patient presents today for a weight check. Patient reports she is tolerating the weekly semaglutide without any issues. She is now exercising with a trainer five days per week. She is now even working out on her work trips!    Past Medical History:  Diagnosis Date   Allergy      Family History  Problem Relation Age of Onset   Hypertension Mother    Hypertension Father    Breast cancer Maternal Aunt    Hypertension Maternal Grandmother    Hypertension Maternal Grandfather    Hypertension Paternal Grandmother    Heart disease Paternal Grandfather    Hypertension Paternal Grandfather      Current Outpatient Medications:    cetirizine (ZYRTEC) 10 MG tablet, , Disp: , Rfl:    Loratadine (CLARITIN PO), Take by mouth as needed., Disp: , Rfl:    VITAMIN D PO, Take 1,000 Units by mouth daily., Disp: , Rfl:    Semaglutide, 1 MG/DOSE, (OZEMPIC, 1 MG/DOSE,) 4 MG/3ML SOPN, INJECT 1MG  INTO THE SKIN ONCE A WEEK, Disp: 3 mL, Rfl: 2   No Known Allergies   Review of Systems  Constitutional: Negative.   Respiratory: Negative.    Cardiovascular: Negative.   Gastrointestinal: Negative.   Neurological: Negative.   Psychiatric/Behavioral: Negative.      Today's Vitals   08/26/21 1201  BP: 122/80  Pulse:  73  Temp: 98.3 F (36.8 C)  Weight: 283 lb 3.2 oz (128.5 kg)  Height: 5' 8.2" (1.732 m)  PainSc: 0-No pain   Body mass index is 42.81 kg/m.  Wt Readings from Last 3 Encounters:  08/26/21 283 lb 3.2 oz (128.5 kg)  05/21/21 291 lb 12.8 oz (132.4 kg)  01/10/21 296 lb 6.4 oz (134.4 kg)     Objective:  Physical Exam Vitals and nursing note reviewed.  Constitutional:      Appearance: Normal appearance.  HENT:     Head: Normocephalic and atraumatic.  Eyes:     Extraocular Movements: Extraocular movements intact.  Cardiovascular:     Rate and Rhythm: Normal rate and regular rhythm.     Heart sounds: Normal heart sounds.  Pulmonary:     Effort: Pulmonary effort is normal.     Breath sounds: Normal breath sounds.  Musculoskeletal:     Cervical back: Normal range of motion.  Skin:    General: Skin is warm.  Neurological:     General: No focal deficit present.     Mental Status: She is alert.  Psychiatric:        Mood and Affect: Mood normal.        Behavior: Behavior normal.     Assessment And Plan:     1. Class 3 severe obesity due to excess calories without serious comorbidity  with body mass index (BMI) of 40.0 to 44.9 in adult [facility]) Comments: BMI 42. She was congratulated on her 12 lb weight loss since Feb 2023. She was congratulated on her lifestyle changes. F/u 8 weeks.   2. Elevated liver enzymes Comments: Mild elevation of ALT at last visit, will recheck at her next visit. She is encouraged to limit her sugar intake.    Patient was given opportunity to ask questions. Patient verbalized understanding of the plan and was able to repeat key elements of the plan. All questions were answered to their satisfaction.   I, Maximino Greenland, MD, have reviewed all documentation for this visit. The documentation on 08/26/21 for the exam, diagnosis, procedures, and orders are all accurate and complete.   IF YOU HAVE BEEN REFERRED TO A SPECIALIST, IT MAY TAKE 1-2 WEEKS TO  SCHEDULE/PROCESS THE REFERRAL. IF YOU HAVE NOT HEARD FROM US/SPECIALIST IN TWO WEEKS, PLEASE GIVE [place] A CALL AT [phone].   THE PATIENT IS ENCOURAGED TO PRACTICE SOCIAL DISTANCING DUE TO THE COVID-19 PANDEMIC.

## 2021-11-19 ENCOUNTER — Ambulatory Visit: Payer: 59 | Admitting: Internal Medicine

## 2021-12-04 ENCOUNTER — Ambulatory Visit: Payer: 59 | Admitting: Internal Medicine

## 2021-12-04 ENCOUNTER — Encounter: Payer: Self-pay | Admitting: Internal Medicine

## 2021-12-04 VITALS — BP 128/68 | HR 70 | Temp 98.0°F | Ht 68.0 in | Wt 286.6 lb

## 2021-12-04 DIAGNOSIS — Z6841 Body Mass Index (BMI) 40.0 and over, adult: Secondary | ICD-10-CM

## 2021-12-04 DIAGNOSIS — J3089 Other allergic rhinitis: Secondary | ICD-10-CM

## 2021-12-04 MED ORDER — MONTELUKAST SODIUM 10 MG PO TABS: 10.0000 mg | ORAL_TABLET | Freq: Every day | ORAL | 2 refills | Status: DC

## 2021-12-04 NOTE — Patient Instructions (Signed)

## 2021-12-04 NOTE — Progress Notes (Signed)
Hershal Coria Martin,acting as a Neurosurgeon for Gwynneth Aliment, MD.,have documented all relevant documentation on the behalf of Gwynneth Aliment, MD,as directed by  Gwynneth Aliment, MD while in the presence of Gwynneth Aliment, MD.    Subjective:     Patient ID: Katherine Conway , female    DOB: 01/24/1971 , 51 y.o.   MRN: [id number]   Chief Complaint  Patient presents with   Weight Check    HPI  Patient presents today for a weight check. Patient reports she is tolerating the weekly semaglutide without any issues. She denies having any headaches, chest pain and shortness of breath.    Wt Readings from Last 3 Encounters: 12/04/21 : 286 lb 9.6 oz (130 kg) 08/26/21 : 283 lb 3.2 oz (128.5 kg) 05/21/21 : 291 lb 12.8 oz (132.4 kg)       Past Medical History:  Diagnosis Date   Allergy      Family History  Problem Relation Age of Onset   Hypertension Mother    Hypertension Father    Breast cancer Maternal Aunt    Hypertension Maternal Grandmother    Hypertension Maternal Grandfather    Hypertension Paternal Grandmother    Heart disease Paternal Grandfather    Hypertension Paternal Grandfather      Current Outpatient Medications:    cetirizine (ZYRTEC) 10 MG tablet, , Disp: , Rfl:    Loratadine (CLARITIN PO), Take by mouth as needed., Disp: , Rfl:    montelukast (SINGULAIR) 10 MG tablet, Take 1 tablet (10 mg total) by mouth daily., Disp: 30 tablet, Rfl: 2   Semaglutide, 1 MG/DOSE, (OZEMPIC, 1 MG/DOSE,) 4 MG/3ML SOPN, INJECT 1MG  INTO THE SKIN ONCE A WEEK, Disp: 3 mL, Rfl: 2   VITAMIN D PO, Take 1,000 Units by mouth daily., Disp: , Rfl:    No Known Allergies   Review of Systems  Constitutional: Negative.   HENT:  Positive for postnasal drip.   Respiratory: Negative.    Cardiovascular: Negative.   Gastrointestinal: Negative.   Neurological: Negative.   Psychiatric/Behavioral: Negative.       Today's Vitals   12/04/21 1547  BP: 128/68  Pulse: 70  Temp: 98 F (36.7 C)   TempSrc: Oral  Weight: 286 lb 9.6 oz (130 kg)  Height: 5\' 8"  (1.727 m)  PainSc: 0-No pain   Body mass index is 43.58 kg/m.  Wt Readings from Last 3 Encounters:  12/04/21 286 lb 9.6 oz (130 kg)  08/26/21 283 lb 3.2 oz (128.5 kg)  05/21/21 291 lb 12.8 oz (132.4 kg)      Objective:  Physical Exam Vitals and nursing note reviewed.  Constitutional:      Appearance: Normal appearance. She is obese.  HENT:     Head: Normocephalic and atraumatic.  Eyes:     Extraocular Movements: Extraocular movements intact.  Cardiovascular:     Rate and Rhythm: Normal rate and regular rhythm.     Heart sounds: Normal heart sounds.  Pulmonary:     Effort: Pulmonary effort is normal.     Breath sounds: Normal breath sounds.  Musculoskeletal:     Cervical back: Normal range of motion.  Skin:    General: Skin is warm.  Neurological:     General: No focal deficit present.     Mental Status: She is alert.  Psychiatric:        Mood and Affect: Mood normal.        Behavior: Behavior normal.  Assessment And Plan:     1. Class 3 severe obesity due to excess calories without serious comorbidity with body mass index (BMI) of 40.0 to 44.9 in adult [facility]) Comments: She will c/w semaglutide 1mg  weekly. She will f/u in 8-12 weeks.   2. Seasonal allergic rhinitis due to other allergic trigger Comments: I will send rx montelukast 10mg  daily. She will let me know if her sx persist.    Patient was given opportunity to ask questions. Patient verbalized understanding of the plan and was able to repeat key elements of the plan. All questions were answered to their satisfaction.   I, 10-12, MD, have reviewed all documentation for this visit. The documentation on 12/04/21 for the exam, diagnosis, procedures, and orders are all accurate and complete.   IF YOU HAVE BEEN REFERRED TO A SPECIALIST, IT MAY TAKE 1-2 WEEKS TO SCHEDULE/PROCESS THE REFERRAL. IF YOU HAVE NOT HEARD FROM US/SPECIALIST IN  TWO WEEKS, PLEASE GIVE Gwynneth Aliment A CALL AT [phone].   THE PATIENT IS ENCOURAGED TO PRACTICE SOCIAL DISTANCING DUE TO THE COVID-19 PANDEMIC.

## 2022-02-25 ENCOUNTER — Other Ambulatory Visit: Payer: Self-pay | Admitting: Obstetrics and Gynecology

## 2022-02-25 DIAGNOSIS — E559 Vitamin D deficiency, unspecified: Secondary | ICD-10-CM

## 2022-02-28 ENCOUNTER — Other Ambulatory Visit: Payer: Self-pay | Admitting: Internal Medicine

## 2022-03-18 ENCOUNTER — Ambulatory Visit: Payer: 59 | Admitting: Internal Medicine

## 2022-04-21 ENCOUNTER — Encounter: Payer: Self-pay | Admitting: *Deleted

## 2022-04-21 NOTE — Progress Notes (Unsigned)
PATIENT: Katherine Conway DOB: Aug 04, 1970  REASON FOR VISIT: follow up HISTORY FROM: patient PRIMARY NEUROLOGIST:   Virtual Visit via Video Note  I connected with Katherine Conway on 04/22/22 at  1:15 PM EST by a video enabled telemedicine application located remotely at [facility] and verified that I am speaking with the correct person using two identifiers who was located at their own home.   I discussed the limitations of evaluation and management by telemedicine and the availability of in person appointments. The patient expressed understanding and agreed to proceed.   PATIENT: Katherine Conway DOB: 28-Dec-1970  REASON FOR VISIT: follow up HISTORY FROM: patient  HISTORY OF PRESENT ILLNESS: Today 04/22/22  Katherine Conway is a 52 y.o. female with a history of OSA on CPAP. Returns today for follow-up.  She reports that the CPAP is working well for her.  She states that she no longer wants to nap throughout the day.  Her download is below        REVIEW OF SYSTEMS: Out of a complete 14 system review of symptoms, the patient complains only of the following symptoms, and all other reviewed systems are negative.  ALLERGIES: No Known Allergies  HOME MEDICATIONS: Outpatient Medications Prior to Visit  Medication Sig Dispense Refill   cetirizine (ZYRTEC) 10 MG tablet      Loratadine (CLARITIN PO) Take by mouth as needed.     montelukast (SINGULAIR) 10 MG tablet TAKE 1 TABLET BY MOUTH EVERY DAY 90 tablet 1   Semaglutide, 1 MG/DOSE, (OZEMPIC, 1 MG/DOSE,) 4 MG/3ML SOPN INJECT 1MG  INTO THE SKIN ONCE A WEEK 3 mL 2   VITAMIN D PO Take 1,000 Units by mouth daily.     No facility-administered medications prior to visit.    PAST MEDICAL HISTORY: Past Medical History:  Diagnosis Date   Allergy     PAST SURGICAL HISTORY: Past Surgical History:  Procedure Laterality Date   CESAREAN SECTION      FAMILY HISTORY: Family History  Problem Relation  Age of Onset   Hypertension Mother    Hypertension Father    Breast cancer Maternal Aunt    Hypertension Maternal Grandmother    Hypertension Maternal Grandfather    Hypertension Paternal Grandmother    Heart disease Paternal Grandfather    Hypertension Paternal Grandfather     SOCIAL HISTORY: Social History   Socioeconomic History   Marital status: Divorced    Spouse name: Not on file   Number of children: Not on file   Years of education: Not on file   Highest education level: Not on file  Occupational History   Occupation: [employer]   Tobacco Use   Smoking status: Never   Smokeless tobacco: Never  Vaping Use   Vaping Use: Never used  Substance and Sexual Activity   Alcohol use: Yes    Comment: occasional wine    Drug use: No   Sexual activity: Not Currently  Other Topics Concern   Not on file  Social History Narrative   Coffee sometimes    Right handed    Social Determinants of Health   Financial Resource Strain: Not on file  Food Insecurity: Not on file  Transportation Needs: Not on file  Physical Activity: Not on file  Stress: Not on file  Social Connections: Not on file  Intimate Partner Violence: Not on file      PHYSICAL EXAM Generalized: Well developed, in no acute distress  Neurological examination  Mentation: Alert oriented to time, place, history taking. Follows all commands speech and language fluent Cranial nerve II-XII:Extraocular movements were full. Facial symmetry noted.Marland Kitchen Head turning and shoulder shrug  were normal and symmetric.  DIAGNOSTIC DATA (LABS, IMAGING, TESTING) - I reviewed patient records, labs, notes, testing and imaging myself where available.  Lab Results  Component Value Date   WBC 5.8 05/21/2021   HGB 14.2 05/21/2021   HCT 42.5 05/21/2021   MCV 92 05/21/2021   PLT 299 05/21/2021      Component Value Date/Time   NA 142 05/21/2021 1156   K 4.5 05/21/2021 1156   CL 105 05/21/2021 1156   CO2 24  05/21/2021 1156   GLUCOSE 82 05/21/2021 1156   BUN 16 05/21/2021 1156   CREATININE 0.87 05/21/2021 1156   CALCIUM 9.0 05/21/2021 1156   PROT 6.9 05/21/2021 1156   ALBUMIN 4.6 05/21/2021 1156   AST 26 05/21/2021 1156   ALT 33 (H) 05/21/2021 1156   ALKPHOS 86 05/21/2021 1156   BILITOT 0.6 05/21/2021 1156   GFRNONAA 73 02/07/2020 0000   GFRAA 85 02/07/2020 0000   Lab Results  Component Value Date   CHOL 187 05/21/2021   HDL 56 05/21/2021   LDLCALC 117 (H) 05/21/2021   TRIG 75 05/21/2021   CHOLHDL 3.3 05/21/2021   Lab Results  Component Value Date   HGBA1C 5.3 02/17/2019   No results found for: "VITAMINB12" Lab Results  Component Value Date   TSH 1.91 02/07/2020      ASSESSMENT AND PLAN 52 y.o. year old female  has a past medical history of Allergy. here with:  OSA on CPAP  CPAP compliance excellent Residual AHI is good Encouraged patient to continue using CPAP nightly and > 4 hours each night F/U in 1 year or sooner if needed    Butch Penny, MSN, NP-C 04/22/2022, 1:03 PM [facility] [address] [place], [place] [address] [phone]

## 2022-04-22 ENCOUNTER — Telehealth: Payer: 59 | Admitting: Adult Health

## 2022-04-22 DIAGNOSIS — G4733 Obstructive sleep apnea (adult) (pediatric): Secondary | ICD-10-CM | POA: Diagnosis not present

## 2022-04-24 ENCOUNTER — Ambulatory Visit
Admission: RE | Admit: 2022-04-24 | Discharge: 2022-04-24 | Disposition: A | Discharge status: Home / Self Care | Payer: 59 | Source: Ambulatory Visit | Attending: Obstetrics and Gynecology | Admitting: Obstetrics and Gynecology

## 2022-04-24 DIAGNOSIS — E559 Vitamin D deficiency, unspecified: Secondary | ICD-10-CM

## 2022-05-08 ENCOUNTER — Ambulatory Visit: Payer: 59 | Admitting: Internal Medicine

## 2022-05-22 ENCOUNTER — Encounter: Payer: 59 | Admitting: Internal Medicine

## 2022-06-05 ENCOUNTER — Encounter: Payer: Self-pay | Admitting: Internal Medicine

## 2022-06-05 ENCOUNTER — Other Ambulatory Visit: Payer: Self-pay | Admitting: Internal Medicine

## 2022-06-05 ENCOUNTER — Ambulatory Visit (INDEPENDENT_AMBULATORY_CARE_PROVIDER_SITE_OTHER): Payer: 59 | Admitting: Internal Medicine

## 2022-06-05 VITALS — BP 110/78 | HR 57 | Ht 68.0 in | Wt 298.0 lb

## 2022-06-05 DIAGNOSIS — Z6841 Body Mass Index (BMI) 40.0 and over, adult: Secondary | ICD-10-CM

## 2022-06-05 DIAGNOSIS — H4710 Unspecified papilledema: Secondary | ICD-10-CM

## 2022-06-05 DIAGNOSIS — Z0000 Encounter for general adult medical examination without abnormal findings: Secondary | ICD-10-CM

## 2022-06-05 MED ORDER — ZEPBOUND 5 MG/0.5ML ~~LOC~~ SOAJ: 5.0000 mg | SUBCUTANEOUS | 0 refills | Status: DC

## 2022-06-05 NOTE — Progress Notes (Signed)
I,Katherine Conway,acting as a scribe for Katherine Greenland, MD.,have documented all relevant documentation on the behalf of Katherine Greenland, MD,as directed by  Katherine Greenland, MD while in the presence of Katherine Greenland, MD.   Subjective:     Patient ID: Katherine Conway , female    DOB: [birth year] , 52 y.o.   MRN: [id number]   Chief Complaint  Patient presents with   Annual Exam    HPI  She is here today for a full physical examination.  She is followed by Dr. Cletis Conway for her GYN care. She was last seen Nov 2023. She reports compliance with meds. She has no specific concerns or complaints at this time.      Past Medical History:  Diagnosis Date   Allergy      Family History  Problem Relation Age of Onset   Hypertension Mother    Hypertension Father    Breast cancer Maternal Aunt    Hypertension Maternal Grandmother    Hypertension Maternal Grandfather    Hypertension Paternal Grandmother    Heart disease Paternal Grandfather    Hypertension Paternal Grandfather      Current Outpatient Medications:    cetirizine (ZYRTEC) 10 MG tablet, , Disp: , Rfl:    montelukast (SINGULAIR) 10 MG tablet, TAKE 1 TABLET BY MOUTH EVERY DAY, Disp: 90 tablet, Rfl: 1   tirzepatide (ZEPBOUND) 5 MG/0.5ML Pen, Inject 5 mg into the skin once a week., Disp: 2 mL, Rfl: 0   VITAMIN D PO, Take 1,000 Units by mouth daily., Disp: , Rfl:    No Known Allergies    The patient states she uses none for birth control. Last LMP was Patient's last menstrual period was 03/31/2013 (approximate).. Negative for Dysmenorrhea. Negative for: breast discharge, breast lump(s), breast pain and breast self exam. Associated symptoms include abnormal vaginal bleeding. Pertinent negatives include abnormal bleeding (hematology), anxiety, decreased libido, depression, difficulty falling sleep, dyspareunia, history of infertility, nocturia, sexual dysfunction, sleep disturbances, urinary incontinence, urinary urgency,  vaginal discharge and vaginal itching. Diet regular.The patient states her exercise level is intermittent, she plans to resume working out with her trainer in the near future.    . The patient's tobacco use is:  Social History   Tobacco Use  Smoking Status Never  Smokeless Tobacco Never  . She has been exposed to passive smoke. The patient's alcohol use is:  Social History   Substance and Sexual Activity  Alcohol Use Yes   Comment: occasional wine     Review of Systems  Constitutional: Negative.   HENT: Negative.    Eyes: Negative.   Respiratory: Negative.    Cardiovascular: Negative.   Gastrointestinal: Negative.   Endocrine: Negative.   Genitourinary: Negative.   Musculoskeletal: Negative.   Skin: Negative.   Allergic/Immunologic: Negative.   Neurological: Negative.   Hematological: Negative.   Psychiatric/Behavioral: Negative.       Today's Vitals   06/05/22 1542  BP: 110/78  Pulse: (!) 57  SpO2: 98%  Weight: 298 lb (135.2 kg)  Height: '5\' 8"'$  (1.727 m)   Body mass index is 45.31 kg/m.  Wt Readings from Last 3 Encounters:  06/05/22 298 lb (135.2 kg)  12/04/21 286 lb 9.6 oz (130 kg)  08/26/21 283 lb 3.2 oz (128.5 kg)    Objective:  Physical Exam Vitals and nursing note reviewed.  Constitutional:      Appearance: Normal appearance. She is obese.  HENT:     Head: Normocephalic and  atraumatic.     Right Ear: Tympanic membrane, ear canal and external ear normal.     Left Ear: Tympanic membrane, ear canal and external ear normal.     Nose:     Comments: Masked     Mouth/Throat:     Comments: Masked  Eyes:     Extraocular Movements: Extraocular movements intact.     Conjunctiva/sclera: Conjunctivae normal.     Pupils: Pupils are equal, round, and reactive to light.  Cardiovascular:     Rate and Rhythm: Normal rate and regular rhythm.     Pulses: Normal pulses.     Heart sounds: Normal heart sounds.  Pulmonary:     Effort: Pulmonary effort is normal.      Breath sounds: Normal breath sounds.  Chest:  Breasts:    Tanner Score is 5.     Right: Normal.     Left: Normal.  Abdominal:     General: Bowel sounds are normal.     Palpations: Abdomen is soft.     Comments: Obese, soft, difficult to assess organomegaly  Genitourinary:    Comments: deferred Musculoskeletal:        General: Normal range of motion.     Cervical back: Normal range of motion and neck supple.  Skin:    General: Skin is warm and dry.  Neurological:     General: No focal deficit present.     Mental Status: She is alert and oriented to person, place, and time.  Psychiatric:        Mood and Affect: Mood normal.        Behavior: Behavior normal.         Assessment And Plan:     1. Routine general medical examination at health care facility Comments: A full exam was performed. Importance of monthly SBE was discussed with the patient. She is UTD w/ CRC screening. I will request most recent mammo.  PATIENT IS ADVISED TO GET 30-45 MINUTES REGULAR EXERCISE NO LESS THAN FOUR TO FIVE DAYS PER WEEK - BOTH WEIGHTBEARING EXERCISES AND AEROBIC ARE RECOMMENDED.  PATIENT IS ADVISED TO FOLLOW A HEALTHY DIET WITH AT LEAST SIX FRUITS/VEGGIES PER DAY, DECREASE INTAKE OF RED MEAT, AND TO INCREASE FISH INTAKE TO TWO DAYS PER WEEK.  MEATS/FISH SHOULD NOT BE FRIED, BAKED OR BROILED IS PREFERABLE.  IT IS ALSO IMPORTANT TO CUT BACK ON YOUR SUGAR INTAKE. PLEASE AVOID ANYTHING WITH ADDED SUGAR, CORN SYRUP OR OTHER SWEETENERS. IF YOU MUST USE A SWEETENER, YOU CAN TRY STEVIA. IT IS ALSO IMPORTANT TO AVOID ARTIFICIALLY SWEETENERS AND DIET BEVERAGES. LASTLY, I SUGGEST WEARING SPF 50 SUNSCREEN ON EXPOSED PARTS AND ESPECIALLY WHEN IN THE DIRECT SUNLIGHT FOR AN EXTENDED PERIOD OF TIME.  PLEASE AVOID FAST FOOD RESTAURANTS AND INCREASE YOUR WATER INTAKE. - CMP14+EGFR - CBC - Lipid panel - TSH  2. Papilledema, left eye Comments: This was seen on most recent eye exam. Ophthalmology will be referring her  to Neuro.  3. Class 3 severe obesity due to excess calories without serious comorbidity with body mass index (BMI) of 40.0 to 44.9 in adult [facility]) Comments: I will send rx Zepbound to see if her insurance covers this, denies fam hx thyroid cancer. Also agrees to [facility] clinic referral. - Amb Ref to Medical Weight Management   Patient was given opportunity to ask questions. Patient verbalized understanding of the plan and was able to repeat key elements of the plan. All questions were answered to their satisfaction.  I, Katherine Greenland, MD, have reviewed all documentation for this visit. The documentation on 06/09/22 for the exam, diagnosis, procedures, and orders are all accurate and complete.   THE PATIENT IS ENCOURAGED TO PRACTICE SOCIAL DISTANCING DUE TO THE COVID-19 PANDEMIC.

## 2022-06-05 NOTE — Patient Instructions (Addendum)
[place]  Health Maintenance, Female Adopting a healthy lifestyle and getting preventive care are important in promoting health and wellness. Ask your health care provider about: The right schedule for you to have regular tests and exams. Things you can do on your own to prevent diseases and keep yourself healthy. What should I know about diet, weight, and exercise? Eat a healthy diet  Eat a diet that includes plenty of vegetables, fruits, low-fat dairy products, and lean protein. Do not eat a lot of foods that are high in solid fats, added sugars, or sodium. Maintain a healthy weight Body mass index (BMI) is used to identify weight problems. It estimates body fat based on height and weight. Your health care provider can help determine your BMI and help you achieve or maintain a healthy weight. Get regular exercise Get regular exercise. This is one of the most important things you can do for your health. Most adults should: Exercise for at least 150 minutes each week. The exercise should increase your heart rate and make you sweat (moderate-intensity exercise). Do strengthening exercises at least twice a week. This is in addition to the moderate-intensity exercise. Spend less time sitting. Even light physical activity can be beneficial. Watch cholesterol and blood lipids Have your blood tested for lipids and cholesterol at 52 years of age, then have this test every 5 years. Have your cholesterol levels checked more often if: Your lipid or cholesterol levels are high. You are older than 52 years of age. You are at high risk for heart disease. What should I know about cancer screening? Depending on your health history and family history, you may need to have cancer screening at various ages. This may include screening for: Breast cancer. Cervical cancer. Colorectal cancer. Skin cancer. Lung cancer. What should I know about heart disease, diabetes, and high blood pressure? Blood pressure  and heart disease High blood pressure causes heart disease and increases the risk of stroke. This is more likely to develop in people who have high blood pressure readings or are overweight. Have your blood pressure checked: Every 3-5 years if you are 56-98 years of age. Every year if you are 21 years old or older. Diabetes Have regular diabetes screenings. This checks your fasting blood sugar level. Have the screening done: Once every three years after age 67 if you are at a normal weight and have a low risk for diabetes. More often and at a younger age if you are overweight or have a high risk for diabetes. What should I know about preventing infection? Hepatitis B If you have a higher risk for hepatitis B, you should be screened for this virus. Talk with your health care provider to find out if you are at risk for hepatitis B infection. Hepatitis C Testing is recommended for: Everyone born from [birth year] through 1965. Anyone with known risk factors for hepatitis C. Sexually transmitted infections (STIs) Get screened for STIs, including gonorrhea and chlamydia, if: You are sexually active and are younger than 52 years of age. You are older than 52 years of age and your health care provider tells you that you are at risk for this type of infection. Your sexual activity has changed since you were last screened, and you are at increased risk for chlamydia or gonorrhea. Ask your health care provider if you are at risk. Ask your health care provider about whether you are at high risk for HIV. Your health care provider may recommend a prescription medicine to help  prevent HIV infection. If you choose to take medicine to prevent HIV, you should first get tested for HIV. You should then be tested every 3 months for as long as you are taking the medicine. Pregnancy If you are about to stop having your period (premenopausal) and you may become pregnant, seek counseling before you get pregnant. Take 400 to  800 micrograms (mcg) of folic acid every day if you become pregnant. Ask for birth control (contraception) if you want to prevent pregnancy. Osteoporosis and menopause Osteoporosis is a disease in which the bones lose minerals and strength with aging. This can result in bone fractures. If you are 81 years old or older, or if you are at risk for osteoporosis and fractures, ask your health care provider if you should: Be screened for bone loss. Take a calcium or vitamin D supplement to lower your risk of fractures. Be given hormone replacement therapy (HRT) to treat symptoms of menopause. Follow these instructions at home: Alcohol use Do not drink alcohol if: Your health care provider tells you not to drink. You are pregnant, may be pregnant, or are planning to become pregnant. If you drink alcohol: Limit how much you have to: 0-1 drink a day. Know how much alcohol is in your drink. In the [place] one drink equals one 12 oz bottle of beer (355 mL), one 5 oz glass of wine (148 mL), or one 1 oz glass of hard liquor (44 mL). Lifestyle Do not use any products that contain nicotine or tobacco. These products include cigarettes, chewing tobacco, and vaping devices, such as e-cigarettes. If you need help quitting, ask your health care provider. Do not use street drugs. Do not share needles. Ask your health care provider for help if you need support or information about quitting drugs. General instructions Schedule regular health, dental, and eye exams. Stay current with your vaccines. Tell your health care provider if: You often feel depressed. You have ever been abused or do not feel safe at home. Summary Adopting a healthy lifestyle and getting preventive care are important in promoting health and wellness. Follow your health care provider's instructions about healthy diet, exercising, and getting tested or screened for diseases. Follow your health care provider's instructions on monitoring  your cholesterol and blood pressure. This information is not intended to replace advice given to you by your health care provider. Make sure you discuss any questions you have with your health care provider. Document Revised: 08/13/2020 Document Reviewed: 08/13/2020 Elsevier Patient Education  [place].

## 2022-06-09 NOTE — Telephone Encounter (Signed)
Pharmacy comment: Alternative Requested:NOT COVERED.

## 2022-06-13 ENCOUNTER — Encounter: Payer: Self-pay | Admitting: Internal Medicine

## 2022-06-13 LAB — LIPID PANEL
Chol/HDL Ratio: 2.9 ratio (ref 0.0–4.4)
Cholesterol, Total: 182 mg/dL (ref 100–199)
HDL: 62 mg/dL (ref >39)
LDL Chol Calc (NIH): 109 mg/dL — ABNORMAL HIGH (ref 0–99)
Triglycerides: 55 mg/dL (ref 0–149)
VLDL Cholesterol Cal: 11 mg/dL (ref 5–40)

## 2022-06-13 LAB — CMP14+EGFR
ALT: 21 IU/L (ref 0–32)
AST: 20 IU/L (ref 0–40)
Albumin/Globulin Ratio: 2 (ref 1.2–2.2)
Albumin: 4.1 g/dL (ref 3.8–4.9)
Alkaline Phosphatase: 80 IU/L (ref 44–121)
BUN/Creatinine Ratio: 24 — ABNORMAL HIGH (ref 9–23)
BUN: 22 mg/dL (ref 6–24)
Bilirubin Total: 0.5 mg/dL (ref 0.0–1.2)
CO2: 23 mmol/L (ref 20–29)
Calcium: 9 mg/dL (ref 8.7–10.2)
Chloride: 105 mmol/L (ref 96–106)
Creatinine, Ser: 0.92 mg/dL (ref 0.57–1.00)
Globulin, Total: 2.1 g/dL (ref 1.5–4.5)
Glucose: 95 mg/dL (ref 70–99)
Potassium: 4.4 mmol/L (ref 3.5–5.2)
Sodium: 140 mmol/L (ref 134–144)
Total Protein: 6.2 g/dL (ref 6.0–8.5)
eGFR: 75 mL/min/{1.73_m2} (ref >59)

## 2022-06-13 LAB — CBC
Hematocrit: 39.1 % (ref 34.0–46.6)
Hemoglobin: 13.2 g/dL (ref 11.1–15.9)
MCH: 31.2 pg (ref 26.6–33.0)
MCHC: 33.8 g/dL (ref 31.5–35.7)
MCV: 92 fL (ref 79–97)
Platelets: 217 10*3/uL (ref 150–450)
RBC: 4.23 x10E6/uL (ref 3.77–5.28)
RDW: 12.6 % (ref 11.7–15.4)
WBC: 6.1 10*3/uL (ref 3.4–10.8)

## 2022-06-13 LAB — TSH: TSH: 2.64 u[IU]/mL (ref 0.450–4.500)

## 2022-06-18 ENCOUNTER — Encounter: Payer: Self-pay | Admitting: Neurology

## 2022-06-18 ENCOUNTER — Telehealth: Payer: Self-pay | Admitting: Neurology

## 2022-06-18 ENCOUNTER — Ambulatory Visit: Payer: 59 | Admitting: Neurology

## 2022-06-18 VITALS — BP 147/82 | HR 88 | Ht 67.0 in | Wt 299.8 lb

## 2022-06-18 DIAGNOSIS — G4733 Obstructive sleep apnea (adult) (pediatric): Secondary | ICD-10-CM | POA: Insufficient documentation

## 2022-06-18 DIAGNOSIS — G932 Benign intracranial hypertension: Secondary | ICD-10-CM

## 2022-06-18 DIAGNOSIS — H4710 Unspecified papilledema: Secondary | ICD-10-CM | POA: Insufficient documentation

## 2022-06-18 MED ORDER — ALPRAZOLAM 0.5 MG PO TABS: ORAL_TABLET | ORAL | 0 refills | Status: DC

## 2022-06-18 NOTE — Addendum Note (Signed)
Addended by: Larey Seat on: 06/18/2022 09:26 AM   Modules accepted: Orders

## 2022-06-18 NOTE — Patient Instructions (Signed)
Optic nerve edema- ?  mild, no cupping, no vascular congestion.

## 2022-06-18 NOTE — Telephone Encounter (Signed)
[employer] sent to [facility] they obtain auth [id number] 

## 2022-06-18 NOTE — Progress Notes (Addendum)
SLEEP MEDICINE CLINIC    Provider:  Larey Seat, MD  Primary Care Physician:  Glendale Chard, [address] [place] [address]     Referring Provider: Katherine Mantle, [address] [place],  [place] [address]          Chief Complaint according to patient   Patient presents with:     New Patient (Initial Visit)     Patient here today to discuss poss. Papilledema , left eye       HISTORY OF PRESENT ILLNESS:  Katherine Conway is a 52 y.o. female patient who is seen upon referral on 06/18/2022 from Dr Sherran Needs at [facility] care  for a neurological consultation.  Chief concern according to patient :  No symptoms, no headaches, no blurred vision, no change in colour vision ( red ).    I have the pleasure of seeing Katherine Conway 06/18/22 a right-handed female with a possible optic nerve abnormality, referred by OD Katherine Mantle, [facility].    The patient had not had any symptoms, the swelling of the left optic nerve was incidentally  discovered in a retinal scan and eye exam, scheduled routinely.   This patient was seen yearly for sleep apnea, last on 04-22-22 by Ward Givens, NP . She has been a compliant user of CPAP.  She loves her CPAP.  Since she was last seen in just January of this year I briefly want to recap her current compliance which is at 100%.  Average of 8 hours 26 minutes, using an AutoSet between 6 and 16 cmH2O pressure was 1 cm expiratory relief, residual AHI is 0.7/h.  No central apneas arising.  95th percentile pressure is 9 cm water.  No significant air leakage excellent data.  Epworth sleepiness scale endorsed today at 4 fatigue severity score at 17.     Review of Systems: Out of a complete 14 system review, the patient complains of only the following symptoms, and all other reviewed systems are negative.:     Negative.  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   Tobacco Use   Smoking status: Never   Smokeless tobacco: Never  Vaping Use   Vaping Use: Never used  Substance and Sexual Activity   Alcohol use: Yes    Comment: occasional wine    Drug use: No   Sexual activity: Not Currently  Other Topics Concern   Not on file  Social History Narrative   Coffee sometimes    Right handed    Social Determinants of Health   Financial Resource Strain: Not on file  Food Insecurity: Not on file  Transportation Needs: Not on file  Physical Activity: Not on file  Stress: Not on file  Social Connections: Not on file    Family History  Problem Relation Age of Onset   Hypertension Mother    Hypertension Father    Breast cancer Maternal Aunt    Hypertension Maternal Grandmother    Hypertension Maternal Grandfather    Hypertension Paternal Grandmother    Heart disease Paternal Grandfather    Hypertension Paternal Merchant navy officer  Past Medical History:  Diagnosis Date   Allergy     Past Surgical History:  Procedure Laterality Date   CESAREAN SECTION  1998     Current Outpatient Medications on File Prior to Visit  Medication Sig Dispense Refill   cetirizine (ZYRTEC) 10 MG tablet      montelukast (SINGULAIR) 10 MG tablet TAKE 1 TABLET BY MOUTH EVERY DAY 90 tablet 1   tirzepatide (ZEPBOUND) 5 MG/0.5ML Pen Inject 5 mg into the skin once a week. 2 mL 0   VITAMIN D PO Take 1,000 Units by mouth daily.     No current facility-administered medications on file prior to visit.     DIAGNOSTIC DATA (LABS, IMAGING, TESTING) - I reviewed patient records, labs, notes, testing and imaging myself where available.   Eye exam with OD, no pictures provided, faxed report.   Lab Results   Component Value Date   WBC 6.1 06/12/2022   HGB 13.2 06/12/2022   HCT 39.1 06/12/2022   MCV 92 06/12/2022   PLT 217 06/12/2022      Component Value Date/Time   NA 140 06/12/2022 0916   K 4.4 06/12/2022 0916   CL 105 06/12/2022 0916   CO2 23 06/12/2022 0916   GLUCOSE 95 06/12/2022 0916   BUN 22 06/12/2022 0916   CREATININE 0.92 06/12/2022 0916   CALCIUM 9.0 06/12/2022 0916   PROT 6.2 06/12/2022 0916   ALBUMIN 4.1 06/12/2022 0916   AST 20 06/12/2022 0916   ALT 21 06/12/2022 0916   ALKPHOS 80 06/12/2022 0916   BILITOT 0.5 06/12/2022 0916   GFRNONAA 73 02/07/2020 0000   GFRAA 85 02/07/2020 0000   Lab Results  Component Value Date   CHOL 182 06/12/2022   HDL 62 06/12/2022   LDLCALC 109 (H) 06/12/2022   TRIG 55 06/12/2022   CHOLHDL 2.9 06/12/2022   Lab Results  Component Value Date   HGBA1C 5.3 02/17/2019   No results found for: "VITAMINB12" Lab Results  Component Value Date   TSH 2.640 06/12/2022    PHYSICAL EXAM:  Today's Vitals   06/18/22 0831  BP: (!) 147/82  Pulse: 88  Weight: 299 lb 12.8 oz (136 kg)  Height: '5\' 7"'$  (1.702 m)   Body mass index is 46.96 kg/m.   Wt Readings from Last 3 Encounters:  06/18/22 299 lb 12.8 oz (136 kg)  06/05/22 298 lb (135.2 kg)  12/04/21 286 lb 9.6 oz (130 kg)     Ht Readings from Last 3 Encounters:  06/18/22 '5\' 7"'$  (1.702 m)  06/05/22 '5\' 8"'$  (1.727 m)  12/04/21 '5\' 8"'$  (1.727 m)      General: The patient is awake, alert and appears not in acute distress. The patient is well groomed. Head: Normocephalic, atraumatic. Neck is supple.  Mallampati 2,  neck circumference:16.5 inches . Nasal airflow  patent.  Dental status: biological  Cardiovascular:  Regular rate and cardiac rhythm by pulse,  without distended neck veins. Respiratory: Lungs are clear to auscultation.  Skin:  Without evidence of ankle edema, or rash. Trunk: The patient's posture is erect.   NEUROLOGIC EXAM: The patient is awake and alert, oriented to  place and time.   Memory subjective described as intact.  Attention span & concentration ability appears normal.  Speech is fluent,  without  dysarthria, dysphonia or aphasia.  Mood and affect are appropriate.   Cranial nerves: no loss of smell or taste reported  Pupils are equal and briskly reactive to light. Acuity was equal  in left and right eye, 20/20 with eye glasses.   Funduscopic exam ; there is no cupping or vascular congestion of the left papilla optici. No pain with eye movements.  Extraocular movements in vertical and horizontal planes were intact and without nystagmus.  No Diplopia. Visual fields by finger perimetry are intact. Hearing was intact to soft voice and finger rubbing.    Facial sensation intact to fine touch.  Facial motor strength is symmetric and tongue and uvula move midline.  Neck ROM : rotation, tilt and flexion extension were normal for age and shoulder shrug was symmetrical.    Motor exam:  Symmetric bulk, tone and ROM.     Sensory:  deferred   Coordination: deferred.   Gait and station: Patient could rise unassisted from a seated position, walked without assistive device.  Stance is of normal width/ base.  Toe and heel walk were deferred.  Deep tendon reflexes: in the  upper and lower extremities are symmetric and intact.  Babinski response was deferred.     ASSESSMENT AND PLAN 52 y.o. 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 Will order MRI brain with special attention to the orbit and  optic nerve on the left Plan to order MRI with special attention to the optic nerve and orbit. Rule out ICH and demyelination. The patient has just had a CMET , all normal. I ordered   tabs po xanax pre medication for MRI and advised that she needs a driver to get to the MRI appointment and back.    I plan to follow up either personally or through our NP within 1-2 months.   I would like to thank Glendale Chard, MD and  Katherine Mantle, [address] [place],  [place] [id number] for allowing me to meet with and to take care of this pleasant patient.   CC: I will share my notes with PCP and OD .  After spending a total time of  30  minutes face to face and additional time for physical and neurologic examination, review of laboratory studies,  personal review of imaging studies, reports and results of other testing and review of referral information / records as far as provided in visit,   Electronically signed by: Larey Seat, MD 06/18/2022 8:45 AM  [facility] certified by The [employer] of Sleep Medicine and Diplomate of the [employer] of Sleep Medicine. Board certified In Neurology through the [place], Fellow of the [employer] of Neurology. Medical Director of [employer].

## 2022-07-13 ENCOUNTER — Ambulatory Visit
Admission: RE | Admit: 2022-07-13 | Discharge: 2022-07-13 | Disposition: A | Discharge status: Home / Self Care | Payer: 59 | Source: Ambulatory Visit | Attending: Neurology | Admitting: Neurology

## 2022-07-13 DIAGNOSIS — H4710 Unspecified papilledema: Secondary | ICD-10-CM

## 2022-07-13 DIAGNOSIS — G932 Benign intracranial hypertension: Secondary | ICD-10-CM | POA: Diagnosis not present

## 2022-07-13 DIAGNOSIS — G4733 Obstructive sleep apnea (adult) (pediatric): Secondary | ICD-10-CM

## 2022-07-13 MED ORDER — GADOPICLENOL 0.5 MMOL/ML IV SOLN
10.0000 mL | Freq: Once | INTRAVENOUS | Status: AC | PRN
  Administered 2022-07-13: 10 mL via INTRAVENOUS

## 2022-07-15 DIAGNOSIS — Z0289 Encounter for other administrative examinations: Secondary | ICD-10-CM

## 2022-07-15 NOTE — Progress Notes (Signed)
Normal pituitary gland, upper orbital veins were slightly enlarged and can indicate more brain fluid pressure-  still this patient is safe to undergo CSF testing and spinal tap.

## 2022-07-16 ENCOUNTER — Other Ambulatory Visit: Payer: Self-pay | Admitting: Neurology

## 2022-07-16 ENCOUNTER — Encounter (INDEPENDENT_AMBULATORY_CARE_PROVIDER_SITE_OTHER): Payer: 59 | Admitting: Family Medicine

## 2022-07-16 ENCOUNTER — Encounter: Payer: Self-pay | Admitting: Neurology

## 2022-07-16 DIAGNOSIS — H4710 Unspecified papilledema: Secondary | ICD-10-CM

## 2022-07-16 DIAGNOSIS — G932 Benign intracranial hypertension: Secondary | ICD-10-CM

## 2022-07-29 ENCOUNTER — Ambulatory Visit
Admission: RE | Admit: 2022-07-29 | Discharge: 2022-07-29 | Disposition: A | Discharge status: Home / Self Care | Payer: 59 | Source: Ambulatory Visit | Attending: Neurology | Admitting: Neurology

## 2022-07-29 VITALS — BP 134/68 | HR 54

## 2022-07-29 DIAGNOSIS — H4710 Unspecified papilledema: Secondary | ICD-10-CM

## 2022-07-29 DIAGNOSIS — G932 Benign intracranial hypertension: Secondary | ICD-10-CM

## 2022-07-29 LAB — CSF CULTURE W GRAM STAIN

## 2022-07-29 NOTE — Discharge Instructions (Signed)

## 2022-07-30 LAB — CSF CELL COUNT WITH DIFFERENTIAL: RBC Count, CSF: 0 cells/uL

## 2022-07-30 LAB — PROTEIN, CSF: Total Protein, CSF: 20 mg/dL (ref 15–45)

## 2022-08-02 LAB — CSF CELL COUNT WITH DIFFERENTIAL: TOTAL NUCLEATED CELL: 2 cells/uL (ref 0–5)

## 2022-08-02 LAB — CSF CULTURE W GRAM STAIN
MICRO NUMBER:: 14861742
Result:: NO GROWTH
SPECIMEN QUALITY:: ADEQUATE

## 2022-08-02 LAB — GLUCOSE, CSF: Glucose, CSF: 64 mg/dL (ref 40–80)

## 2022-08-06 ENCOUNTER — Ambulatory Visit: Payer: 59 | Admitting: Neurology

## 2022-08-06 ENCOUNTER — Encounter: Payer: Self-pay | Admitting: Neurology

## 2022-08-06 VITALS — BP 115/76 | HR 59 | Ht 68.0 in | Wt 305.0 lb

## 2022-08-06 DIAGNOSIS — Z6841 Body Mass Index (BMI) 40.0 and over, adult: Secondary | ICD-10-CM | POA: Diagnosis not present

## 2022-08-06 DIAGNOSIS — Z8669 Personal history of other diseases of the nervous system and sense organs: Secondary | ICD-10-CM | POA: Insufficient documentation

## 2022-08-06 DIAGNOSIS — G4733 Obstructive sleep apnea (adult) (pediatric): Secondary | ICD-10-CM

## 2022-08-06 NOTE — Patient Instructions (Signed)
KEEPING IT CLEAN: CPAP HYGIENE PROPER UPKEEP OF YOUR CPAP MACHINE CAN HELP ENSURE THE DEVICE FUNCTIONS PROPERLY CPAP CLEANING INSTRUCTIONS Along with proper CPAP cleaning it is recommended that you replace your mask, tubing and filters once very 3 months and more frequently if you are sick.   DAILY CLEANING Do not use moisturizing soaps, bleach, scented oils, chlorine, or alcohol-based solutions to clean your supplies. These solutions may cause irritation to your skin and lungs and may reduce the life of your products. Dawn Dish soap or Comparable works best for daily cleaning.  **If you've been sick, it's smart to wash your mask, tubing, humidifier and filter daily until your cold, flu or virus symptoms are gone. That can help reduce the amount of time you spend under the weather.  Before using your mask -wash your face daily with soap and water to remove excess facial oils. Wipe down your mask (including areas that come in contact with your skin) using a damp towel with soap and warm water. This will remove any oils, dead skin cells, and sweat on the mask that can affect the quality of the seal. Gently rinse with a clean towel and let the mask air-dry out of direct sunlight. You can also use unscented baby wipes or pre-moistened towels designed specifically for cleaning CPAP masks, which are available on-line. DO NOT USE CLOROX OR DISINFECTING WIPES. If your unit has a humidifier, empty any leftover water instead of letting in sit in the unit all day. Refill the humidifier with clean, distilled water right before bedtime for optimal use WEEKLY (OR MORE FREQUENT) CLEANING Your mask and tubing need a full bath at least once a week to keep it free of dust, bacteria, and germs. (During COVID-19 or any other flu/virus we recommend more frequent cleaning) Clean the CPAP tubing, nasal mask, and headgear in a bathroom sink filled with warm water and a few drops of ammonia-free, mild dish detergent. Avoid  using stronger cleaning products, as they may damage the mask or leave harmful residue. Swirl all parts around for about five minutes, rinse well and let air dry during the day. Hang the tubing over the shower rod, on a towel rack or in the laundry room to ensure all the water drips out. The mask and headgear can be air-dried on a towel or hung on a hook or hanger. You should also wipe down your CPAP machine with a damp cloth. Ensure the unit is unplugged. The towel shouldn't be too damp or wet, as water could get into the machine. Clean the filter by removing it and rinsing it in warm tap water. Run it under the water and squeeze to make sure there is no dust. Then blot down the filter with a towel. Do not wash your machine's white filter, if one is present--those are disposable and should be replaced every two weeks. If you are recovering from being sick, we recommend changing the filter sooner. If your CPAP has a humidifier, that also needs to be cleaned weekly. Empty any remaining water and then wash the water chamber in the sink with warm soapy water. Rinse well and drain out as much of the water as possible. Let the chamber air-dry before placing it back into the CPAP unit. Every other week you should disinfect the humidifier. Do that by soaking it in a solution of one-part vinegar to five parts water for 30 minutes, thoroughly rinsing and then placing in your dishwasher's top rack for washing. And keep   it clean by using only distilled water to prevent mineral deposits that can build up and cause damage to your machine. IMPORTANT TIPS Make caring for your CPAP equipment part of your morning routine. Keep machine and accessories out of direct sunlight to avoid damaging them. Never use bleach to clean accessories. Place machine on a level surface and away from curtains that may interfere with the air intake. Keep track of when you should order replacement parts for your mask and accessories so that  you always get the most out of your CPAP.  

## 2022-08-06 NOTE — Progress Notes (Signed)
Provider:  Melvyn Novas, MD  Primary Care Physician:  Dorothyann Peng, MD [address] [place] [address]     Referring Provider: Dorothyann Peng, Md [address] [place],  [place] [address]          Chief Complaint according to patient   Patient presents with:     New Patient (Initial Visit)           HISTORY OF PRESENT ILLNESS:  Katherine Conway is a 52 y.o. female patient who is here for revisit 08/06/2022 for CPAP -compliance.   08-06-2022: She is an allergy sufferer and takes Zyrtec at night and Singulair in AM.   Chief concern according to patient : " I lost my sense taste a months ago, its slowly coming back. Everything tasted like cardboard.   I didn't feel any viral illness at the time , no change in medication. I tested negative for COVID 19. " Her papilledema was not related to ICP elevation after she underwent confirmatory CSF testing.  I would like to add that the patient neither had headaches when she presented with the very mild papilledema nor did she have significant headaches after the spinal tap.  Today I see 100% compliance for days and hours on her CPAP.  8 hours on average each night the setting is still between 6 and 16 cmH2O with 1 expiratory pressure relief centimeter water and the residual is an AHI of 0.7 excellent.  So apnea is fully controlled the 95th percentile pressure is 9.3 cm water, air leak is very low which speaks for a well-fitting mask at 0.6 L/min.  No central apneas arising and her Epworth Sleepiness Scale was endorsed at 5 points her fatigue severity score at 20 out of 63 points.  Clinically this has worked very well for her.   Her current medications list aside from Zyrtec and Singulair is only of vitamin D supplement about 1000 units/day.      Katherine Conway is a 52 y.o. female patient who is seen upon referral on 06/18/2022 from Dr Joycelyn Das at [facility]  for a neurological consultation.  Chief  concern according to patient :  No symptoms, no headaches, no blurred vision, no change in colour vision ( red ).    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    The patient had not had any symptoms, the swelling of the left optic nerve was incidentally  discovered in a retinal scan and eye exam, scheduled routinely.    This patient was seen yearly for sleep apnea, last on 04-22-22 by Butch Penny, NP . She has been a compliant user of CPAP.  She loves her CPAP.  Since she was last seen in just January of this year I briefly want to recap her current compliance which is at 100%.  Average of 8 hours 26 minutes, using an AutoSet between 6 and 16 cmH2O pressure was 1 cm expiratory relief, residual AHI is 0.7/h.  No central apneas arising.  95th percentile pressure is 9 cm water.  No significant air leakage excellent data.  Epworth sleepiness scale endorsed today at 4 fatigue severity score at 17.      Review of Systems: Out of a complete 14 system review, the patient complains of only the following symptoms, and all other  reviewed systems are negative.:     How likely are you to doze in the following situations: 0 = not likely, 1 = slight chance, 2 = moderate chance, 3 = high chance   Sitting and Reading? Watching Television? Sitting inactive in a public place (theater or meeting)? As a passenger in a car for an hour without a break? Lying down in the afternoon when circumstances permit? Sitting and talking to someone? Sitting quietly after lunch without alcohol? In a car, while stopped for a few minutes in traffic?   Epworth Sleepiness Scale was endorsed at 5 points her fatigue severity score at 20 out of 63 points.  Clinically this has worked very well for her.   Social History   Socioeconomic History   Marital status: Divorced    Spouse name: Not on file    Number of children: Not on file   Years of education: Not on file   Highest education level: Not on file  Occupational History   Occupation: [employer]   Tobacco Use   Smoking status: Never   Smokeless tobacco: Never  Vaping Use   Vaping Use: Never used  Substance and Sexual Activity   Alcohol use: Yes    Comment: occasional wine    Drug use: No   Sexual activity: Not Currently  Other Topics Concern   Not on file  Social History Narrative   Coffee sometimes    Right handed    Social Determinants of Health   Financial Resource Strain: Not on file  Food Insecurity: Not on file  Transportation Needs: Not on file  Physical Activity: Not on file  Stress: Not on file  Social Connections: Not on file    Family History  Problem Relation Age of Onset   Hypertension Mother    Hypertension Father    Breast cancer Maternal Aunt    Hypertension Maternal Grandmother    Hypertension Maternal Grandfather    Hypertension Paternal Grandmother    Heart disease Paternal Grandfather    Hypertension Paternal Grandfather     Past Medical History:  Diagnosis Date   Allergy     Past Surgical History:  Procedure Laterality Date   CESAREAN SECTION       Current Outpatient Medications on File Prior to Visit  Medication Sig Dispense Refill   cetirizine (ZYRTEC) 10 MG tablet      montelukast (SINGULAIR) 10 MG tablet TAKE 1 TABLET BY MOUTH EVERY DAY 90 tablet 1   VITAMIN D PO Take 1,000 Units by mouth daily.     No current facility-administered medications on file prior to visit.    No Known Allergies   DIAGNOSTIC DATA (LABS, IMAGING, TESTING) - I reviewed patient records, labs, notes, testing and imaging myself where available.  Lab Results  Component Value Date   WBC 6.1 06/12/2022   HGB 13.2 06/12/2022   HCT 39.1 06/12/2022   MCV 92 06/12/2022   PLT 217 06/12/2022      Component Value Date/Time   NA 140 06/12/2022 0916   K 4.4 06/12/2022 0916   CL 105  06/12/2022 0916   CO2 23 06/12/2022 0916   GLUCOSE 95 06/12/2022 0916   BUN 22 06/12/2022 0916   CREATININE 0.92 06/12/2022 0916   CALCIUM 9.0 06/12/2022 0916   PROT 6.2 06/12/2022 0916   ALBUMIN 4.1 06/12/2022 0916   AST 20 06/12/2022 0916   ALT 21 06/12/2022 0916   ALKPHOS 80 06/12/2022 0916   BILITOT 0.5 06/12/2022 0916  GFRNONAA 73 02/07/2020 0000   GFRAA 85 02/07/2020 0000   Lab Results  Component Value Date   CHOL 182 06/12/2022   HDL 62 06/12/2022   LDLCALC 109 (H) 06/12/2022   TRIG 55 06/12/2022   CHOLHDL 2.9 06/12/2022   Lab Results  Component Value Date   HGBA1C 5.3 02/17/2019   No results found for: "VITAMINB12" Lab Results  Component Value Date   TSH 2.640 06/12/2022    PHYSICAL EXAM:  Today's Vitals   08/06/22 0831  BP: 115/76  Pulse: (!) 59  Weight: (!) 305 lb (138.3 kg)  Height: 5\' 8"  (1.727 m)   Body mass index is 46.38 kg/m.   Wt Readings from Last 3 Encounters:  08/06/22 (!) 305 lb (138.3 kg)  06/18/22 299 lb 12.8 oz (136 kg)  06/05/22 298 lb (135.2 kg)     Ht Readings from Last 3 Encounters:  08/06/22 5\' 8"  (1.727 m)  06/18/22 5\' 7"  (1.702 m)  06/05/22 5\' 8"  (1.727 m)      General: The patient is awake, alert and appears not in acute distress. The patient is well groomed. Head: Normocephalic, atraumatic. Neck is supple.  Mallampati 2,  neck circumference:16.00 down from 16. 75 " !!!  inches . Nasal airflow patent 08-06-2022.  Retrognathia - there is an overbite noticed.  Dental status: intact  Cardiovascular:  Regular rate and cardiac rhythm by pulse,  without distended neck veins. Respiratory: Lungs are clear to auscultation.  Skin:  Without evidence of ankle edema, or rash. Trunk: The patient's posture is erect.   Neurologic exam : The patient is awake and alert, oriented to place and time.   Memory subjective described as intact.  Attention span & concentration ability appears normal.  Speech is fluent,  without dysarthria,  dysphonia or aphasia.  Mood and affect are appropriate.   Cranial nerves: no loss of smell or taste reported - fully vaccinated.  Pupils are equal and briskly reactive to light. Funduscopic exam deferred.   Extraocular movements in vertical and horizontal planes were intact and without nystagmus. No Diplopia.Visual fields by finger perimetry are intact. Hearing was intact to soft voice and finger rubbing.  Facial sensation intact to fine touch.  Facial motor strength is symmetric and tongue and uvula move midline.  Neck ROM : rotation, tilt and flexion extension were normal for age and shoulder shrug was symmetrical.    Motor exam:  Symmetric bulk, tone and ROM.   Normal tone without cog -wheeling, symmetric grip strength .   Sensory:  Fine touch, pinprick and vibration were tested  and  normal.  Proprioception tested in the upper extremities was normal.   Coordination: Rapid alternating movements in the fingers/hands were of normal speed.  No changes in penmanship reported.  The Finger-to-nose maneuver was intact without evidence of ataxia, dysmetria or tremor.   Gait and station: Patient could rise unassisted from a seated position, walked without assistive device.  Stance is of normal width/ base.  Toe and heel walk were deferred.  Deep tendon reflexes: in the  upper and lower extremities are symmetric and intact.  Babinski response was deferred.     ASSESSMENT AND PLAN 53 y.o. year old female  here with:   1) Sleep apnea, OSA diagnosed and treated with CPAP in a highly compliant patient:  Her OSA risk remains still  elevated by BMI, Neck size.   There is no longer  non-restorative seep, no more  morning headaches .  No longer  feeling  fatigued and cognitively ' foggy"  CONTINUE CPAP!     1) Weight loss to be medically guided, I hope she can obtain one of the coming PO medications. .  Please  continue exercises, core strength influences our breathing depth.Continue CPAP use!     I plan to follow up either personally or through our NP within 12 months.   I would like to thank  Dorothyann Peng, Md [address] [place],  [place] [address] for allowing me to meet with and to take care of this pleasant patient.   CC: I will share my notes with Silverio Lay, MD .Sheral Apley Gyn.   After spending a total time of  24  minutes face to face and additional time for physical and neurologic examination, review of laboratory studies,  personal review of imaging studies, reports and results of other testing and review of referral information / records as far as provided in visit,   Electronically signed by: Melvyn Novas, MD 08/06/2022 8:47 AM  [facility] Board certified by The [employer] of Sleep Medicine and Diplomate of the [employer] of [employer] Medicine. Board certified In Neurology through the ABPN, Fellow of the [employer] of Neurology. Medical Director of [facility].

## 2022-08-26 ENCOUNTER — Encounter (INDEPENDENT_AMBULATORY_CARE_PROVIDER_SITE_OTHER): Payer: Self-pay | Admitting: Family Medicine

## 2022-08-26 ENCOUNTER — Ambulatory Visit (INDEPENDENT_AMBULATORY_CARE_PROVIDER_SITE_OTHER): Payer: 59 | Admitting: Family Medicine

## 2022-08-26 VITALS — BP 133/81 | HR 58 | Temp 98.6°F | Ht 69.0 in | Wt 305.0 lb

## 2022-08-26 DIAGNOSIS — E7800 Pure hypercholesterolemia, unspecified: Secondary | ICD-10-CM

## 2022-08-26 DIAGNOSIS — E559 Vitamin D deficiency, unspecified: Secondary | ICD-10-CM | POA: Diagnosis not present

## 2022-08-26 DIAGNOSIS — R7303 Prediabetes: Secondary | ICD-10-CM

## 2022-08-26 DIAGNOSIS — Z6841 Body Mass Index (BMI) 40.0 and over, adult: Secondary | ICD-10-CM

## 2022-08-26 DIAGNOSIS — G4733 Obstructive sleep apnea (adult) (pediatric): Secondary | ICD-10-CM

## 2022-08-26 DIAGNOSIS — R0602 Shortness of breath: Secondary | ICD-10-CM

## 2022-08-26 DIAGNOSIS — E669 Obesity, unspecified: Secondary | ICD-10-CM

## 2022-08-26 DIAGNOSIS — R5383 Other fatigue: Secondary | ICD-10-CM

## 2022-08-26 DIAGNOSIS — Z1331 Encounter for screening for depression: Secondary | ICD-10-CM

## 2022-08-26 NOTE — Progress Notes (Signed)
Chief Complaint:   OBESITY Katherine Conway (MR# [id number]) is a 52 y.o. female who presents for evaluation and treatment of obesity and related comorbidities. Current BMI is Body mass index is 45.04 kg/m. Katherine Conway has been struggling with her weight for many years and has been unsuccessful in either losing weight, maintaining weight loss, or reaching her healthy weight goal.  Katherine Conway is currently in the action stage of change and ready to dedicate time achieving and maintaining a healthier weight. Katherine Conway is interested in becoming our patient and working on intensive lifestyle modifications including (but not limited to) diet and exercise for weight loss.  Referred by Dr. Allyne Gee.  Patient came to information session.  Works as Warehouse manager, Music therapist for [employer].  Works 40 hours a week 9-5 M-F from home. Lives alone and not anticipating any sabotage from family or friends.  Desired weight is 199- last time she was that weight was in the early 2000s.  Never lost the pregnancy weight from 2nd pregnancy. Has previously done weight watchers, optavia, small dose ozempic, intermittent fasting, green smoothies.  Only made it up to the 1mg  Ozempic.  Total time on med was about 1 year and patient has regained around 20lbs. Eats out 2-3 times a week.  Travels about once a month with her job.  Food recall: protein smoothie in am- 1.5 cup vanilla almond milk or oat milk and 2 handfuls of spring mix and small handful of berries and half banana and scoop protein (vega protein powder).  Feels satisfied.  Typically no snacks between that and lunch. Lunch is typically salad with peppers, mushrooms, cucumbers, vinegarette dressing, grilled chicken or tuna or boiled egg.  Feels satisfied.  Other option could be leftovers from dinner.  Dinner is often meat, vegetable and rice; sometimes she does Hello Fresh.  Snack in the afternoon- fruit (watermelon, cantaloupe,  apple).  Then snacks more as stress increases.  Potato chips, crackers, popcorn. Recognizes there is emotional snacking that happens- sometimes with conscious thought and sometimes without.   Verl's habits were reviewed today and are as follows: her desired weight loss is 106 lbs, she has been heavy most of her life, she started gaining weight after marriage and pregnancy, her heaviest weight ever was 319 pounds, she has significant food cravings issues, she snacks frequently in the evenings, she skips meals frequently, she is frequently drinking liquids with calories, she frequently makes poor food choices, and she struggles with emotional eating.  Depression Screen Katherine Conway's Food and Mood (modified PHQ-9) score was 4.  Subjective:   1. Other fatigue [name] admits to daytime somnolence and denies waking up still tired. Patient has a history of symptoms of daytime fatigue. [name] generally gets 6 or 7 hours of sleep per night, and states that she has generally restful sleep. Snoring is not present. Apneic episodes are not present. Epworth Sleepiness Score is 5. EKG-sinus bradycardia.   2. SOBOE (shortness of breath on exertion) Katherine Conway notes increasing shortness of breath with exercising and seems to be worsening over time with weight gain. She notes getting out of breath sooner with activity than she used to. This has not gotten worse recently. Katherine Conway denies shortness of breath at rest or orthopnea.  3. Prediabetes Katherine Conway had an elevated A1c 6 years ago. Insulin minimal elevated.   4. OSA on CPAP Yasmene has a CPAP and uses it 100% compliantly. 0.7 gents per hour.   5. Vitamin D deficiency Katherine Conway's  only level in Epic is from 6 years ago at 7.7. Recent test showing Vit D level. She notes fatigue.   6. Pure hypercholesterolemia Katherine Conway's last LDL was 109, HDL, 62, and triglycerides 55. Elevated LDL for 3 years.   Assessment/Plan:   1. Other fatigue Katherine Conway does feel that her  weight is causing her energy to be lower than it should be. Fatigue may be related to obesity, depression or many other causes. Labs will be ordered, and in the meanwhile, Katherine Conway will focus on self care including making healthy food choices, increasing physical activity and focusing on stress reduction.  - EKG 12-Lead - Vitamin B12 - Folate - T4, free - T3  2. SOBOE (shortness of breath on exertion) Katherine Conway does feel that she gets out of breath more easily that she used to when she exercises. Katherine Conway's shortness of breath appears to be obesity related and exercise induced. She has agreed to work on weight loss and gradually increase exercise to treat her exercise induced shortness of breath. Will continue to monitor closely.  3. Prediabetes We will recheck labs today, and we will follow-up at [name]'s next appointment.   - Comprehensive metabolic panel - Hemoglobin A1c - Insulin, random  4. OSA on CPAP Katherine Conway will follow-up with Neurology at her next scheduled appointment.   5. Vitamin D deficiency We will recheck labs today, and we will follow-up at Katherine Conway's next appointment.   - VITAMIN D 25 Hydroxy (Vit-D Deficiency, Fractures)  6. Pure hypercholesterolemia We will repeat FLP in 3 months.   7. Depression screening Katherine Conway had a negative depression screening.   8. BMI 40.0-44.9, adult (HCC)  9. Obesity with starting BMI of 45.1 Katherine Conway is currently in the action stage of change and her goal is to continue with weight loss efforts. I recommend Katherine Conway begin the structured treatment plan as follows:  She has agreed to the Category 3 Plan with 8 oz of protein at dinner.  Exercise goals: No exercise has been prescribed at this time.   Behavioral modification strategies: increasing lean protein intake, meal planning and cooking strategies, keeping healthy foods in the home, and planning for success.  She was informed of the importance of frequent follow-up visits to  maximize her success with intensive lifestyle modifications for her multiple health conditions. She was informed we would discuss her lab results at her next visit unless there is a critical issue that needs to be addressed sooner. Katherine Conway agreed to keep her next visit at the agreed upon time to discuss these results.  Objective:   Blood pressure 133/81, pulse (!) 58, temperature 98.6 F (37 C), height 5\' 9"  (1.753 m), weight (!) 305 lb (138.3 kg), last menstrual period 03/31/2013, SpO2 100 %. Body mass index is 45.04 kg/m.  EKG: Normal sinus rhythm, rate 52 BPM.  Indirect Calorimeter completed today shows a VO2 of 257 and a REE of 1771.  Her calculated basal metabolic rate is 1610 thus her basal metabolic rate is worse than expected.  General: Cooperative, alert, well developed, in no acute distress. HEENT: Conjunctivae and lids unremarkable. Cardiovascular: Regular rhythm.  Lungs: Normal work of breathing. Neurologic: No focal deficits.   Lab Results  Component Value Date   CREATININE 0.92 06/12/2022   BUN 22 06/12/2022   NA 140 06/12/2022   K 4.4 06/12/2022   CL 105 06/12/2022   CO2 23 06/12/2022   Lab Results  Component Value Date   ALT 21 06/12/2022   AST 20 06/12/2022  ALKPHOS 80 06/12/2022   BILITOT 0.5 06/12/2022   Lab Results  Component Value Date   HGBA1C 5.3 02/17/2019   HGBA1C 5.4 01/27/2018   HGBA1C 5.9 09/05/2015   Lab Results  Component Value Date   INSULIN 16.8 05/21/2021   INSULIN 13.8 01/10/2021   INSULIN 7.7 07/30/2020   Lab Results  Component Value Date   TSH 2.640 06/12/2022   Lab Results  Component Value Date   CHOL 182 06/12/2022   HDL 62 06/12/2022   LDLCALC 109 (H) 06/12/2022   TRIG 55 06/12/2022   CHOLHDL 2.9 06/12/2022   Lab Results  Component Value Date   WBC 6.1 06/12/2022   HGB 13.2 06/12/2022   HCT 39.1 06/12/2022   MCV 92 06/12/2022   PLT 217 06/12/2022   No results found for: "IRON", "TIBC", "FERRITIN"  Attestation  Statements:   Reviewed by clinician on day of visit: allergies, medications, problem list, medical history, surgical history, family history, social history, and previous encounter notes.   I, Burt Knack, am acting as transcriptionist for Reuben Likes, MD. This is the patient's first visit at [facility]. The patient's NEW PATIENT PACKET was reviewed at length. Included in the packet: current and past health history, medications, allergies, ROS, gynecologic history (women only), surgical history, family history, social history, weight history, weight loss surgery history (for those that have had weight loss surgery), nutritional evaluation, mood and food questionnaire, PHQ9, Epworth questionnaire, sleep habits questionnaire, patient life and health improvement goals questionnaire. These will all be scanned into the patient's chart under media.   During the visit, I independently reviewed the patient's EKG, bioimpedance scale results, and indirect calorimeter results. I used this information to tailor a meal plan for the patient that will help her to lose weight and will improve her obesity-related conditions going forward. I performed a medically necessary appropriate examination and/or evaluation. I discussed the assessment and treatment plan with the patient. The patient was provided an opportunity to ask questions and all were answered. The patient agreed with the plan and demonstrated an understanding of the instructions. Labs were ordered at this visit and will be reviewed at the next visit unless more critical results need to be addressed immediately. Clinical information was updated and documented in the EMR.    I have reviewed the above documentation for accuracy and completeness, and I agree with the above. - Reuben Likes, MD

## 2022-08-27 LAB — COMPREHENSIVE METABOLIC PANEL
ALT: 26 IU/L (ref 0–32)
AST: 25 IU/L (ref 0–40)
Albumin/Globulin Ratio: 1.5 (ref 1.2–2.2)
Albumin: 4.3 g/dL (ref 3.8–4.9)
Alkaline Phosphatase: 91 IU/L (ref 44–121)
BUN/Creatinine Ratio: 14 (ref 9–23)
BUN: 13 mg/dL (ref 6–24)
Bilirubin Total: 0.7 mg/dL (ref 0.0–1.2)
CO2: 24 mmol/L (ref 20–29)
Calcium: 9.1 mg/dL (ref 8.7–10.2)
Chloride: 103 mmol/L (ref 96–106)
Creatinine, Ser: 0.95 mg/dL (ref 0.57–1.00)
Globulin, Total: 2.9 g/dL (ref 1.5–4.5)
Glucose: 88 mg/dL (ref 70–99)
Potassium: 4.7 mmol/L (ref 3.5–5.2)
Sodium: 141 mmol/L (ref 134–144)
Total Protein: 7.2 g/dL (ref 6.0–8.5)
eGFR: 73 mL/min/{1.73_m2} (ref >59)

## 2022-08-27 LAB — T3: T3, Total: 110 ng/dL (ref 71–180)

## 2022-08-27 LAB — FOLATE: Folate: 7.4 ng/mL (ref >3.0)

## 2022-08-27 LAB — VITAMIN B12: Vitamin B-12: 1230 pg/mL (ref 232–1245)

## 2022-08-27 LAB — INSULIN, RANDOM: INSULIN: 7.9 u[IU]/mL (ref 2.6–24.9)

## 2022-08-27 LAB — HEMOGLOBIN A1C
Est. average glucose Bld gHb Est-mCnc: 117 mg/dL
Hgb A1c MFr Bld: 5.7 % — ABNORMAL HIGH (ref 4.8–5.6)

## 2022-08-27 LAB — VITAMIN D 25 HYDROXY (VIT D DEFICIENCY, FRACTURES): Vit D, 25-Hydroxy: 40.8 ng/mL (ref 30.0–100.0)

## 2022-08-27 LAB — T4, FREE: Free T4: 1.25 ng/dL (ref 0.82–1.77)

## 2022-09-09 ENCOUNTER — Encounter (INDEPENDENT_AMBULATORY_CARE_PROVIDER_SITE_OTHER): Payer: Self-pay | Admitting: Family Medicine

## 2022-09-09 ENCOUNTER — Ambulatory Visit (INDEPENDENT_AMBULATORY_CARE_PROVIDER_SITE_OTHER): Payer: 59 | Admitting: Family Medicine

## 2022-09-09 VITALS — BP 115/81 | HR 73 | Temp 98.3°F | Ht 69.0 in | Wt 294.0 lb

## 2022-09-09 DIAGNOSIS — E7849 Other hyperlipidemia: Secondary | ICD-10-CM

## 2022-09-09 DIAGNOSIS — E559 Vitamin D deficiency, unspecified: Secondary | ICD-10-CM | POA: Diagnosis not present

## 2022-09-09 DIAGNOSIS — R7303 Prediabetes: Secondary | ICD-10-CM | POA: Diagnosis not present

## 2022-09-09 DIAGNOSIS — Z6841 Body Mass Index (BMI) 40.0 and over, adult: Secondary | ICD-10-CM

## 2022-09-09 DIAGNOSIS — E669 Obesity, unspecified: Secondary | ICD-10-CM | POA: Diagnosis not present

## 2022-09-09 NOTE — Progress Notes (Signed)
Chief Complaint:   OBESITY Katherine Conway is here to discuss her progress with her obesity treatment plan along with follow-up of her obesity related diagnoses. Katherine Conway is on the Category 3 Plan and states she is following her eating plan approximately 95% of the time. Katherine Conway states she is exercising with a trainer for 45 minutes 3-4 times per week.  Today's visit was #: 2 Starting weight: 305 lbs Starting date: 08/26/2022 Today's weight: 294 lbs Today's date: 09/09/2022 Total lbs lost to date: 11 Total lbs lost since last in-office visit: 11  Interim History: Patient felt the meal plan went better than she thought.  She found when she got stressed she was too full to snack.  Patient felt each meal was sufficient in terms of quantity.  She feels like it takes her a while to eat all the food on plan. For snack calories she did pita chips and cheese wedge, celery and peanut butter, 3 cups of air popped popcorn, yasso bars.  Occassionally she couldn't get all the snacks in. Going out of town for a week starting in 4 days. She will be eating out.   She will be staying in a hotel.  Lunch is provided and it is a conference so food will be from hotel.  Dinner will be with other participants.    Subjective:   1. Prediabetes Patient's last A1c was 5.7 and insulin 7.9. she was diagnosed at least 7 years ago. She is not on medications. I discussed labs with the patient today.   2. Vitamin D deficiency Patient is on Vitamin D, and she notes fatigue. Her last Vitamin D level was of 40.8.  3. Other hyperlipidemia Patient's last LDL was 109, HDL, 62, and triglycerides 55. 10 year ASCVD risk score of 2.8% (optimal of 1.2%).   Assessment/Plan:   1. Prediabetes Pathophysiology of insulin resistance, pre-diabetes, and diabetes mellitus were discussed today.   2. Vitamin D deficiency Patient agreed to start OTC Vitamin D. Level not at goal; we will repeat labs in 3-4 months after starting  supplementation.  3. Other hyperlipidemia Patient will continue Category 3, with no change in meal plan. We will repeat lab in 3 months.   4. BMI 40.0-44.9, adult (HCC)  5. Obesity with starting BMI of 45.1 Katherine Conway is currently in the action stage of change. As such, her goal is to continue with weight loss efforts. She has agreed to the Category 3 Plan + 8 oz of protein.   Exercise goals: All adults should avoid inactivity. Some physical activity is better than none, and adults who participate in any amount of physical activity gain some health benefits.  Behavioral modification strategies: increasing lean protein intake, increasing vegetables, meal planning and cooking strategies, keeping healthy foods in the home, and planning for success.  Katherine Conway has agreed to follow-up with our clinic in 2 to 3 weeks. She was informed of the importance of frequent follow-up visits to maximize her success with intensive lifestyle modifications for her multiple health conditions.   Objective:   Blood pressure 115/81, pulse 73, temperature 98.3 F (36.8 C), height 5\' 9"  (1.753 m), weight 294 lb (133.4 kg), last menstrual period 03/31/2013, SpO2 100 %. Body mass index is 43.42 kg/m.  General: Cooperative, alert, well developed, in no acute distress. HEENT: Conjunctivae and lids unremarkable. Cardiovascular: Regular rhythm.  Lungs: Normal work of breathing. Neurologic: No focal deficits.   Lab Results  Component Value Date   CREATININE 0.95 08/26/2022  BUN 13 08/26/2022   NA 141 08/26/2022   K 4.7 08/26/2022   CL 103 08/26/2022   CO2 24 08/26/2022   Lab Results  Component Value Date   ALT 26 08/26/2022   AST 25 08/26/2022   ALKPHOS 91 08/26/2022   BILITOT 0.7 08/26/2022   Lab Results  Component Value Date   HGBA1C 5.7 (H) 08/26/2022   HGBA1C 5.3 02/17/2019   HGBA1C 5.4 01/27/2018   HGBA1C 5.9 09/05/2015   Lab Results  Component Value Date   INSULIN 7.9 08/26/2022   INSULIN  16.8 05/21/2021   INSULIN 13.8 01/10/2021   INSULIN 7.7 07/30/2020   Lab Results  Component Value Date   TSH 2.640 06/12/2022   Lab Results  Component Value Date   CHOL 182 06/12/2022   HDL 62 06/12/2022   LDLCALC 109 (H) 06/12/2022   TRIG 55 06/12/2022   CHOLHDL 2.9 06/12/2022   Lab Results  Component Value Date   VD25OH 40.8 08/26/2022   VD25OH 7.7 09/05/2015   Lab Results  Component Value Date   WBC 6.1 06/12/2022   HGB 13.2 06/12/2022   HCT 39.1 06/12/2022   MCV 92 06/12/2022   PLT 217 06/12/2022   No results found for: "IRON", "TIBC", "FERRITIN"  Attestation Statements:   Reviewed by clinician on day of visit: allergies, medications, problem list, medical history, surgical history, family history, social history, and previous encounter notes.  Time spent on visit including pre-visit chart review and post-visit care and charting was 42 minutes.   I, Burt Knack, am acting as transcriptionist for Reuben Likes, MD.  I have reviewed the above documentation for accuracy and completeness, and I agree with the above. - Reuben Likes, MD

## 2022-09-23 ENCOUNTER — Encounter (INDEPENDENT_AMBULATORY_CARE_PROVIDER_SITE_OTHER): Payer: Self-pay | Admitting: Family Medicine

## 2022-09-23 ENCOUNTER — Ambulatory Visit (INDEPENDENT_AMBULATORY_CARE_PROVIDER_SITE_OTHER): Payer: 59 | Admitting: Family Medicine

## 2022-09-23 VITALS — BP 130/80 | HR 66 | Temp 98.3°F | Ht 69.0 in | Wt 296.0 lb

## 2022-09-23 DIAGNOSIS — R7303 Prediabetes: Secondary | ICD-10-CM | POA: Diagnosis not present

## 2022-09-23 DIAGNOSIS — E559 Vitamin D deficiency, unspecified: Secondary | ICD-10-CM | POA: Diagnosis not present

## 2022-09-23 DIAGNOSIS — E669 Obesity, unspecified: Secondary | ICD-10-CM

## 2022-09-23 DIAGNOSIS — Z6841 Body Mass Index (BMI) 40.0 and over, adult: Secondary | ICD-10-CM | POA: Diagnosis not present

## 2022-09-23 NOTE — Progress Notes (Signed)
Chief Complaint:   OBESITY Katherine Conway is here to discuss her progress with her obesity treatment plan along with follow-up of her obesity related diagnoses. [name] is on the Category 3 Plan + 8 oz of protein and states she is following her eating plan approximately 550-60% of the time. [name] states she is at the gym for 45 minutes 2 times per week.  Today's visit was #: 3 Starting weight: 305 lbs Starting date: 08/26/2022 Today's weight: 296 lbs Today's date: 09/23/2022 Total lbs lost to date: 9 Total lbs lost since last in-office visit: 0  Interim History: Patient was out of town for 7 days in a row one a work trip since last appointment.  She tried to stick to calories and protein for breakfast but then be mindful of choices for lunch and dinner.  Next few weeks she will be home and will be staying local for July 4th. She wants to follow the meal plan the next few weeks; not anticipating any sabotage.   Subjective:   1. Vitamin D deficiency Patient is not on prescription vitamin D, but she is on a OTC vitamin D 5000 units now.  She denies nausea, vomiting, or muscle weakness but notes fatigue.  2. Prediabetes Patient's recent A1c was 5.7 (diagnosed many years ago).  She is not on medications.  Assessment/Plan:   1. Vitamin D deficiency Patient will continue OTC vitamin D 5000 units daily.  2. Prediabetes We will repeat labs in 2 to 3 months.  3. BMI 40.0-44.9, adult (HCC)  4. Obesity, starting BMI 45.04 Katherine Conway is currently in the action stage of change. As such, her goal is to continue with weight loss efforts. She has agreed to the Category 3 Plan + 8 oz of protein.   Exercise goals: All adults should avoid inactivity. Some physical activity is better than none, and adults who participate in any amount of physical activity gain some health benefits.  Behavioral modification strategies: increasing lean protein intake, meal planning and cooking strategies, keeping  healthy foods in the home, and planning for success.  Katherine Conway has agreed to follow-up with our clinic in 2 to 3 weeks. She was informed of the importance of frequent follow-up visits to maximize her success with intensive lifestyle modifications for her multiple health conditions.   Objective:   Blood pressure 130/80, pulse 66, temperature 98.3 F (36.8 C), height 5\' 9"  (1.753 m), weight 296 lb (134.3 kg), last menstrual period 03/31/2013, SpO2 95 %. Body mass index is 43.71 kg/m.  General: Cooperative, alert, well developed, in no acute distress. HEENT: Conjunctivae and lids unremarkable. Cardiovascular: Regular rhythm.  Lungs: Normal work of breathing. Neurologic: No focal deficits.   Lab Results  Component Value Date   CREATININE 0.95 08/26/2022   BUN 13 08/26/2022   NA 141 08/26/2022   K 4.7 08/26/2022   CL 103 08/26/2022   CO2 24 08/26/2022   Lab Results  Component Value Date   ALT 26 08/26/2022   AST 25 08/26/2022   ALKPHOS 91 08/26/2022   BILITOT 0.7 08/26/2022   Lab Results  Component Value Date   HGBA1C 5.7 (H) 08/26/2022   HGBA1C 5.3 02/17/2019   HGBA1C 5.4 01/27/2018   HGBA1C 5.9 09/05/2015   Lab Results  Component Value Date   INSULIN 7.9 08/26/2022   INSULIN 16.8 05/21/2021   INSULIN 13.8 01/10/2021   INSULIN 7.7 07/30/2020   Lab Results  Component Value Date   TSH 2.640 06/12/2022   Lab  Results  Component Value Date   CHOL 182 06/12/2022   HDL 62 06/12/2022   LDLCALC 109 (H) 06/12/2022   TRIG 55 06/12/2022   CHOLHDL 2.9 06/12/2022   Lab Results  Component Value Date   VD25OH 40.8 08/26/2022   VD25OH 7.7 09/05/2015   Lab Results  Component Value Date   WBC 6.1 06/12/2022   HGB 13.2 06/12/2022   HCT 39.1 06/12/2022   MCV 92 06/12/2022   PLT 217 06/12/2022   No results found for: "IRON", "TIBC", "FERRITIN"  Attestation Statements:   Reviewed by clinician on day of visit: allergies, medications, problem list, medical history,  surgical history, family history, social history, and previous encounter notes.   I, Burt Knack, am acting as transcriptionist for Reuben Likes, MD.  I have reviewed the above documentation for accuracy and completeness, and I agree with the above. - Reuben Likes, MD

## 2022-10-14 ENCOUNTER — Ambulatory Visit (INDEPENDENT_AMBULATORY_CARE_PROVIDER_SITE_OTHER): Payer: 59 | Admitting: Family Medicine

## 2022-10-14 ENCOUNTER — Encounter (INDEPENDENT_AMBULATORY_CARE_PROVIDER_SITE_OTHER): Payer: Self-pay | Admitting: Family Medicine

## 2022-10-14 VITALS — BP 122/75 | HR 57 | Temp 98.3°F | Ht 69.0 in | Wt 290.0 lb

## 2022-10-14 DIAGNOSIS — R632 Polyphagia: Secondary | ICD-10-CM | POA: Diagnosis not present

## 2022-10-14 DIAGNOSIS — Z6841 Body Mass Index (BMI) 40.0 and over, adult: Secondary | ICD-10-CM

## 2022-10-14 DIAGNOSIS — R7303 Prediabetes: Secondary | ICD-10-CM | POA: Diagnosis not present

## 2022-10-14 DIAGNOSIS — E669 Obesity, unspecified: Secondary | ICD-10-CM | POA: Diagnosis not present

## 2022-10-14 NOTE — Assessment & Plan Note (Signed)
Improving with eating on a schedule and adequate protein intake with breakfast and lunch  Continue to work on meal planning/ prep with protein and fiber with meals and snacks.

## 2022-10-14 NOTE — Assessment & Plan Note (Signed)
Lab Results  Component Value Date   HGBA1C 5.7 (H) 08/26/2022   Doing well with weight reduction on a low sugar reduced kcal nutrition plan with cardio + resistance training 3+ days/ wk Currently not on metformin  Continue active plan for weight reduction Keep one carb serving for dinners out while traveling Recheck A1c in the next 4 mos

## 2022-10-14 NOTE — Progress Notes (Signed)
Office: [phone]  /  Fax: [phone]  WEIGHT SUMMARY AND BIOMETRICS  Starting Date: 08/26/22  Starting Weight: 305lb   Weight Lost Since Last Visit: 6lb   Vitals Temp: 98.3 F (36.8 C) BP: 122/75 Pulse Rate: (!) 57 SpO2: 100 %   Body Composition  Body Fat %: 46.4 % Fat Mass (lbs): 134.6 lbs Muscle Mass (lbs): 147.6 lbs Total Body Water (lbs): 101.6 lbs Visceral Fat Rating : 15     HPI  Chief Complaint: OBESITY  Katherine Conway is here to discuss her progress with her obesity treatment plan. She is on the the Category 3 Plan and states she is following her eating plan approximately 95 % of the time. She states she is exercising 45 minutes 3 times per week.   Interval History:  Since last office visit she is down 6 lb She has been home and able to prepare her meals better She is making good food choices, focused on lean protein and fiber with meals She has been eating out less often Denies cravings for sweets She has a net weight loss of 15 lb in the past 1.5 mos She has a good support system She works from home and will be traveling for work again She walks a lot with travel and tracks her steps She also uses the hotel gym She has a Psychologist, educational here 3 x a week and is feeling stronger   Pharmacotherapy: none  PHYSICAL EXAM:  Blood pressure 122/75, pulse (!) 57, temperature 98.3 F (36.8 C), height 5\' 9"  (1.753 m), weight 290 lb (131.5 kg), last menstrual period 03/31/2013, SpO2 100 %. Body mass index is 42.83 kg/m.  General: She is overweight, cooperative, alert, well developed, and in no acute distress. PSYCH: Has normal mood, affect and thought process.   Lungs: Normal breathing effort, no conversational dyspnea.   ASSESSMENT AND PLAN  TREATMENT PLAN FOR OBESITY:  Recommended Dietary Goals  Katherine Conway is currently in the action stage of change. As such, her goal is to continue weight management plan. She has agreed to the Category 3 Plan.  Behavioral  Intervention  We discussed the following Behavioral Modification Strategies today: increasing lean protein intake, decreasing simple carbohydrates , increasing vegetables, increasing lower glycemic fruits, increasing water intake, work on meal planning and preparation, keeping healthy foods at home, continue to work on implementation of reduced calorie nutritional plan, continue to practice mindfulness when eating, and planning for success.  Additional resources provided today: NA  Recommended Physical Activity Goals  Katherine Conway has been advised to work up to 150 minutes of moderate intensity aerobic activity a week and strengthening exercises 2-3 times per week for cardiovascular health, weight loss maintenance and preservation of muscle mass.   She has agreed to Work on scheduling and tracking physical activity.   Pharmacotherapy changes for the treatment of obesity: none  ASSOCIATED CONDITIONS ADDRESSED TODAY  Prediabetes Assessment & Plan: Lab Results  Component Value Date   HGBA1C 5.7 (H) 08/26/2022   Doing well with weight reduction on a low sugar reduced kcal nutrition plan with cardio + resistance training 3+ days/ wk Currently not on metformin  Continue active plan for weight reduction Keep one carb serving for dinners out while traveling Recheck A1c in the next 4 mos   Obesity, starting BMI 45.04  BMI 40.0-44.9, adult (HCC)  Polyphagia Assessment & Plan: Improving with eating on a schedule and adequate protein intake with breakfast and lunch  Continue to work on meal planning/ prep with protein  and fiber with meals and snacks.        She was informed of the importance of frequent follow up visits to maximize her success with intensive lifestyle modifications for her multiple health conditions.   ATTESTASTION STATEMENTS:  Reviewed by clinician on day of visit: allergies, medications, problem list, medical history, surgical history, family history, social  history, and previous encounter notes pertinent to obesity diagnosis.   I have personally spent 30 minutes total time today in preparation, patient care, nutritional counseling and documentation for this visit, including the following: review of clinical lab tests; review of medical tests/procedures/services.      Glennis Brink, DO DABFM, DABOM [facility] [address] [place], [place] [address] [phone]

## 2022-10-30 ENCOUNTER — Other Ambulatory Visit: Payer: Self-pay | Admitting: Internal Medicine

## 2022-11-06 ENCOUNTER — Encounter (INDEPENDENT_AMBULATORY_CARE_PROVIDER_SITE_OTHER): Payer: Self-pay | Admitting: Family Medicine

## 2022-11-06 ENCOUNTER — Ambulatory Visit (INDEPENDENT_AMBULATORY_CARE_PROVIDER_SITE_OTHER): Payer: 59 | Admitting: Family Medicine

## 2022-11-06 VITALS — BP 141/80 | HR 59 | Temp 98.3°F | Ht 69.0 in | Wt 287.0 lb

## 2022-11-06 DIAGNOSIS — Z6841 Body Mass Index (BMI) 40.0 and over, adult: Secondary | ICD-10-CM

## 2022-11-06 DIAGNOSIS — E669 Obesity, unspecified: Secondary | ICD-10-CM | POA: Diagnosis not present

## 2022-11-06 DIAGNOSIS — R7303 Prediabetes: Secondary | ICD-10-CM

## 2022-11-06 NOTE — Progress Notes (Signed)
Chief Complaint:   OBESITY Syniah is here to discuss her progress with her obesity treatment plan along with follow-up of her obesity related diagnoses. Vonnie is on the Category 3 Plan and states she is following her eating plan approximately 80% of the time. Brithney states she is exercising for 45 minutes 3-4 times per week.  Today's visit was #: 5 Starting weight: 305 lbs Starting date: 08/26/2022 Today's weight: 287 lbs Today's date: 11/06/2022 Total lbs lost to date: 18 Total lbs lost since last in-office visit: 3  Interim History: Patient was gone for 8 days between last appointment and today.  She was able to be more on plan for breakfast and then was hit or miss for lunch and dinner.  She still anticipates upcoming travel for work but only for 2-3 days at a time instead of 8 days. She is moving her daughter to [place] next weekend and then at end of the month she is going to [place] to visit her son then go right into a work trip.   Subjective:   1. Prediabetes Patient's last A1c was 5.7 at start of program. She still notes some carbohydrate indulgences.   Assessment/Plan:   1. Prediabetes We will repeat labs in September or October.   2. BMI 40.0-44.9, adult (HCC)  3. Obesity, starting BMI 45.04 Analys is currently in the action stage of change. As such, her goal is to continue with weight loss efforts. She has agreed to the Category 3 Plan.   Exercise goals: All adults should avoid inactivity. Some physical activity is better than none, and adults who participate in any amount of physical activity gain some health benefits.  Behavioral modification strategies: increasing lean protein intake, meal planning and cooking strategies, keeping healthy foods in the home, and planning for success.  Margaree has agreed to follow-up with our clinic in 2 to 3 weeks. She was informed of the importance of frequent follow-up visits to maximize her success with intensive lifestyle  modifications for her multiple health conditions.   Objective:   Blood pressure (!) 141/80, pulse (!) 59, temperature 98.3 F (36.8 C), height 5\' 9"  (1.753 m), weight 287 lb (130.2 kg), last menstrual period 03/31/2013, SpO2 98%. Body mass index is 42.38 kg/m.  Lab Results  Component Value Date   CREATININE 0.95 08/26/2022   BUN 13 08/26/2022   NA 141 08/26/2022   K 4.7 08/26/2022   CL 103 08/26/2022   CO2 24 08/26/2022   Lab Results  Component Value Date   ALT 26 08/26/2022   AST 25 08/26/2022   ALKPHOS 91 08/26/2022   BILITOT 0.7 08/26/2022   Lab Results  Component Value Date   HGBA1C 5.7 (H) 08/26/2022   HGBA1C 5.3 02/17/2019   HGBA1C 5.4 01/27/2018   HGBA1C 5.9 09/05/2015   Lab Results  Component Value Date   INSULIN 7.9 08/26/2022   INSULIN 16.8 05/21/2021   INSULIN 13.8 01/10/2021   INSULIN 7.7 07/30/2020   Lab Results  Component Value Date   TSH 2.640 06/12/2022   Lab Results  Component Value Date   CHOL 182 06/12/2022   HDL 62 06/12/2022   LDLCALC 109 (H) 06/12/2022   TRIG 55 06/12/2022   CHOLHDL 2.9 06/12/2022   Lab Results  Component Value Date   VD25OH 40.8 08/26/2022   VD25OH 7.7 09/05/2015   Lab Results  Component Value Date   WBC 6.1 06/12/2022   HGB 13.2 06/12/2022   HCT 39.1 06/12/2022  MCV 92 06/12/2022   PLT 217 06/12/2022   No results found for: "IRON", "TIBC", "FERRITIN"  Attestation Statements:   Reviewed by clinician on day of visit: allergies, medications, problem list, medical history, surgical history, family history, social history, and previous encounter notes.   I, Burt Knack, am acting as transcriptionist for Reuben Likes, MD.  I have reviewed the above documentation for accuracy and completeness, and I agree with the above. - Reuben Likes, MD

## 2022-11-27 ENCOUNTER — Ambulatory Visit (INDEPENDENT_AMBULATORY_CARE_PROVIDER_SITE_OTHER): Payer: 59 | Admitting: Family Medicine

## 2022-11-27 ENCOUNTER — Encounter (INDEPENDENT_AMBULATORY_CARE_PROVIDER_SITE_OTHER): Payer: Self-pay | Admitting: Family Medicine

## 2022-11-27 VITALS — BP 117/87 | HR 54 | Temp 98.4°F | Ht 69.0 in | Wt 287.0 lb

## 2022-11-27 DIAGNOSIS — R7303 Prediabetes: Secondary | ICD-10-CM

## 2022-11-27 DIAGNOSIS — Z6841 Body Mass Index (BMI) 40.0 and over, adult: Secondary | ICD-10-CM

## 2022-11-27 DIAGNOSIS — E669 Obesity, unspecified: Secondary | ICD-10-CM | POA: Diagnosis not present

## 2022-11-27 DIAGNOSIS — E559 Vitamin D deficiency, unspecified: Secondary | ICD-10-CM | POA: Diagnosis not present

## 2022-11-27 NOTE — Progress Notes (Signed)
Chief Complaint:   OBESITY Katherine Conway is here to discuss her progress with her obesity treatment plan along with follow-up of her obesity related diagnoses. Katherine Conway is on the Category 3 Plan and states she is following her eating plan approximately 70% of the time. Katherine Conway states she is exercising for 40 minutes 4 times per week.  Today's visit was #: 6 Starting weight: 305 lbs Starting date: 08/26/2022 Today's weight: 287 lbs Today's date: 11/27/2022 Total lbs lost to date: 18 Total lbs lost since last in-office visit: 0  Interim History: Since last appointment patient moved her daughter to [place].  She was there for 9 days and not 2 days like she had planned.  She was mindful of food choices as much as she could with the elevated stress level she had.  She also worked out almost everyday.  She recognized she emotionally ate but it was in much smaller doses than she would have done previously. She is traveling up to [place] to visit her son and that rolls over into a work trip and so she will be gone for almost a week.  Her son lives in [place].   Subjective:   1. Prediabetes Patient's last A1c was 5.7 and insulin 7.9.  She is not on medications.  2. Vitamin D deficiency Patient is on OTC vitamin D.  She denies nausea, vomiting, or muscle weakness but notes fatigue.  Assessment/Plan:   1. Prediabetes Patient will continue her category 3 plan, particularly when she is not traveling. We discussed the importance of mindful eating and food choices when traveling to limit carbohydrate intake.  2. Vitamin D deficiency Patient will continue OTC vitamin D.  3. BMI 40.0-44.9, adult (HCC)  4. Obesity, starting BMI 45.04 Katherine Conway is currently in the action stage of change. As such, her goal is to continue with weight loss efforts. She has agreed to the Category 3 Plan.   Exercise goals: All adults should avoid inactivity. Some physical activity is better than none, and adults who participate in  any amount of physical activity gain some health benefits.  Behavioral modification strategies: increasing lean protein intake, meal planning and cooking strategies, keeping healthy foods in the home, and planning for success.  Katherine Conway has agreed to follow-up with our clinic in 2 weeks. She was informed of the importance of frequent follow-up visits to maximize her success with intensive lifestyle modifications for her multiple health conditions.   Objective:   Blood pressure 117/87, pulse (!) 54, temperature 98.4 F (36.9 C), height 5\' 9"  (1.753 m), weight 287 lb (130.2 kg), last menstrual period 03/31/2013, SpO2 100%. Body mass index is 42.38 kg/m.  General: Cooperative, alert, well developed, in no acute distress. HEENT: Conjunctivae and lids unremarkable. Cardiovascular: Regular rhythm.  Lungs: Normal work of breathing. Neurologic: No focal deficits.   Lab Results  Component Value Date   CREATININE 0.95 08/26/2022   BUN 13 08/26/2022   NA 141 08/26/2022   K 4.7 08/26/2022   CL 103 08/26/2022   CO2 24 08/26/2022   Lab Results  Component Value Date   ALT 26 08/26/2022   AST 25 08/26/2022   ALKPHOS 91 08/26/2022   BILITOT 0.7 08/26/2022   Lab Results  Component Value Date   HGBA1C 5.7 (H) 08/26/2022   HGBA1C 5.3 02/17/2019   HGBA1C 5.4 01/27/2018   HGBA1C 5.9 09/05/2015   Lab Results  Component Value Date   INSULIN 7.9 08/26/2022   INSULIN 16.8 05/21/2021   INSULIN  13.8 01/10/2021   INSULIN 7.7 07/30/2020   Lab Results  Component Value Date   TSH 2.640 06/12/2022   Lab Results  Component Value Date   CHOL 182 06/12/2022   HDL 62 06/12/2022   LDLCALC 109 (H) 06/12/2022   TRIG 55 06/12/2022   CHOLHDL 2.9 06/12/2022   Lab Results  Component Value Date   VD25OH 40.8 08/26/2022   VD25OH 7.7 09/05/2015   Lab Results  Component Value Date   WBC 6.1 06/12/2022   HGB 13.2 06/12/2022   HCT 39.1 06/12/2022   MCV 92 06/12/2022   PLT 217 06/12/2022   No  results found for: "IRON", "TIBC", "FERRITIN"  Attestation Statements:   Reviewed by clinician on day of visit: allergies, medications, problem list, medical history, surgical history, family history, social history, and previous encounter notes.   I, Burt Knack, am acting as transcriptionist for Reuben Likes, MD. I have reviewed the above documentation for accuracy and completeness, and I agree with the above. - Reuben Likes, MD

## 2022-12-04 ENCOUNTER — Ambulatory Visit: Payer: 59 | Admitting: Internal Medicine

## 2022-12-11 ENCOUNTER — Encounter (INDEPENDENT_AMBULATORY_CARE_PROVIDER_SITE_OTHER): Payer: Self-pay | Admitting: Family Medicine

## 2022-12-11 ENCOUNTER — Encounter (INDEPENDENT_AMBULATORY_CARE_PROVIDER_SITE_OTHER): Payer: Self-pay

## 2022-12-11 ENCOUNTER — Telehealth (INDEPENDENT_AMBULATORY_CARE_PROVIDER_SITE_OTHER): Payer: 59 | Admitting: Family Medicine

## 2022-12-11 DIAGNOSIS — Z6841 Body Mass Index (BMI) 40.0 and over, adult: Secondary | ICD-10-CM

## 2022-12-11 DIAGNOSIS — G4733 Obstructive sleep apnea (adult) (pediatric): Secondary | ICD-10-CM | POA: Diagnosis not present

## 2022-12-11 DIAGNOSIS — R7303 Prediabetes: Secondary | ICD-10-CM | POA: Diagnosis not present

## 2022-12-11 NOTE — Progress Notes (Signed)
  TeleHealth Visit:  This visit was completed with telemedicine (audio/video) technology. Admire has verbally consented to this TeleHealth visit. The patient is located at home, the provider is located at home. The participants in this visit include the listed provider and patient. The visit was conducted today via MyChart video.  OBESITY Katherine Conway is here to discuss her progress with her obesity treatment plan along with follow-up of her obesity related diagnoses.   Today's visit was # 7 Starting weight: 305 lbs Starting date: 08/26/22 Weight at clinic today: 286 pounds Total weight loss: 19 lbs   Nutrition Plan: the Category 3 plan   Current exercise:   (strength and cardio) 40 minutes 4 times per week.  Uses a Systems analyst when at home.  Interim History:  She has traveled a good bit this summer and has been able to continue to lose weight. Since May she has lost 19 pounds. She even works out when she is traveling. She will be traveling to [place] for work for a few days in September. She tries to stick tight to plan when at home.  Appetite and cravings controlled for the most part.  Typical day of food when traveling: Breakfast: protein shake and [place] yogurt Lunch: make healthy choices- protein and veggies if possible Dinner: varies, has eaten out more  Drinks mostly water at home. Has a cocktail or two when traveling.  Assessment/Plan:  1. Obstructive Sleep Apnea Katherine Conway has a diagnosis of sleep apnea. She reports that she is using a CPAP regularly. She even travels with it.   Plan: Continue CPAP therapy.  2. Prediabetes Last A1c was 5.7.  Medication(s): None Polyphagia:No Lab Results  Component Value Date   HGBA1C 5.7 (H) 08/26/2022   HGBA1C 5.3 02/17/2019   HGBA1C 5.4 01/27/2018   HGBA1C 5.9 09/05/2015   Lab Results  Component Value Date   INSULIN 7.9 08/26/2022   INSULIN 16.8 05/21/2021   INSULIN 13.8 01/10/2021   INSULIN 7.7 07/30/2020     Plan: Continue to work on lifestyle changes for weight loss.  3. Morbid Obesity: Current BMI 42  Katherine Conway is currently in the action stage of change. As such, her goal is to continue with weight loss efforts.  She has agreed to the Category 3 plan.  Exercise goals:  as is  Behavioral modification strategies: increasing lean protein intake, decreasing simple carbohydrates , better snacking choices, and planning for success.  Katherine Conway has agreed to follow-up with our clinic in 4 weeks.  No orders of the defined types were placed in this encounter.   There are no discontinued medications.   No orders of the defined types were placed in this encounter.     Objective:   VITALS: Per patient if applicable, see vitals. GENERAL: Alert and in no acute distress. CARDIOPULMONARY: No increased WOB. Speaking in clear sentences.  PSYCH: Pleasant and cooperative. Speech normal rate and rhythm. Affect is appropriate. Insight and judgement are appropriate. Attention is focused, linear, and appropriate.  NEURO: Oriented as arrived to appointment on time with no prompting.   Attestation Statements:   Reviewed by clinician on day of visit: allergies, medications, problem list, medical history, surgical history, family history, social history, and previous encounter notes.   This was prepared with the assistance of Engineer, civil (consulting).  Occasional wrong-word or sound-a-like substitutions may have occurred due to the inherent limitations of voice recognition software.

## 2023-01-01 ENCOUNTER — Ambulatory Visit: Payer: 59 | Admitting: Internal Medicine

## 2023-01-05 ENCOUNTER — Ambulatory Visit (INDEPENDENT_AMBULATORY_CARE_PROVIDER_SITE_OTHER): Payer: 59 | Admitting: Family Medicine

## 2023-01-05 VITALS — BP 122/78 | HR 57 | Temp 98.1°F | Ht 69.0 in | Wt 286.0 lb

## 2023-01-05 DIAGNOSIS — E669 Obesity, unspecified: Secondary | ICD-10-CM | POA: Diagnosis not present

## 2023-01-05 DIAGNOSIS — E559 Vitamin D deficiency, unspecified: Secondary | ICD-10-CM | POA: Diagnosis not present

## 2023-01-05 DIAGNOSIS — R7303 Prediabetes: Secondary | ICD-10-CM

## 2023-01-05 DIAGNOSIS — Z6841 Body Mass Index (BMI) 40.0 and over, adult: Secondary | ICD-10-CM

## 2023-01-05 NOTE — Progress Notes (Signed)
Chief Complaint:   OBESITY Katherine Conway is here to discuss her progress with her obesity treatment plan along with follow-up of her obesity related diagnoses. Katherine Conway is on the Category 3 Plan and states she is following her eating plan approximately 90% of the time. Katherine Conway states she is exercising for 45 minutes 2-3 times per week.  Today's visit was #: 8 Starting weight: 305 lbs Starting date: 08/26/2022 Today's weight: 286 lbs Today's date: 01/05/2023 Total lbs lost to date: 19 Total lbs lost since last in-office visit: 0  Interim History:  Patient did a virtual appointment at last appointment.  She went on 1 trip and otherwise she is really focusing on staying as close to the meal plan as she can.  She exercised 1 time last week instead of her normal 3-4 times.  She is home the next couple of weeks and then has a trip to [place].  She tries to stay mindful when traveling to get her protein in and limit carbohydrate. No issues getting all protein in or all snack calories in. She is doing a combination of cardio and strength when she is working out.   Subjective:   1. Prediabetes Patient's last A1c was 5.7 and insulin 7.9.  She is not on medications.  She is staying consistent on category 3.  2. Vitamin D deficiency Patient denies nausea, vomiting, or muscle weakness but notes fatigue.  Assessment/Plan:   1. Prediabetes Patient will continue with her category 3 meal plan.  She will have repeat labs done with her PCP.  2. Vitamin D deficiency Patient will have repeat labs done with her PCP.  3. BMI 40.0-44.9, adult (HCC)  4. Obesity with starting BMI of 45.1 Katherine Conway is currently in the action stage of change. As such, her goal is to continue with weight loss efforts. She has agreed to the Category 3 Plan.   Exercise goals: All adults should avoid inactivity. Some physical activity is better than none, and adults who participate in any amount of physical activity gain some health  benefits.  I encouraged the patient to start tracking weights and reps to be able to monitor her progress.  Behavioral modification strategies: increasing lean protein intake, meal planning and cooking strategies, keeping healthy foods in the home, and planning for success.  Katherine Conway has agreed to follow-up with our clinic in 3 weeks. She was informed of the importance of frequent follow-up visits to maximize her success with intensive lifestyle modifications for her multiple health conditions.   Objective:   Blood pressure 122/78, pulse (!) 57, temperature 98.1 F (36.7 C), height 5\' 9"  (1.753 m), weight 286 lb (129.7 kg), last menstrual period 03/31/2013, SpO2 100%. Body mass index is 42.23 kg/m.  General: Cooperative, alert, well developed, in no acute distress. HEENT: Conjunctivae and lids unremarkable. Cardiovascular: Regular rhythm.  Lungs: Normal work of breathing. Neurologic: No focal deficits.   Lab Results  Component Value Date   CREATININE 0.95 08/26/2022   BUN 13 08/26/2022   NA 141 08/26/2022   K 4.7 08/26/2022   CL 103 08/26/2022   CO2 24 08/26/2022   Lab Results  Component Value Date   ALT 26 08/26/2022   AST 25 08/26/2022   ALKPHOS 91 08/26/2022   BILITOT 0.7 08/26/2022   Lab Results  Component Value Date   HGBA1C 5.7 (H) 08/26/2022   HGBA1C 5.3 02/17/2019   HGBA1C 5.4 01/27/2018   HGBA1C 5.9 09/05/2015   Lab Results  Component Value Date  INSULIN 7.9 08/26/2022   INSULIN 16.8 05/21/2021   INSULIN 13.8 01/10/2021   INSULIN 7.7 07/30/2020   Lab Results  Component Value Date   TSH 2.640 06/12/2022   Lab Results  Component Value Date   CHOL 182 06/12/2022   HDL 62 06/12/2022   LDLCALC 109 (H) 06/12/2022   TRIG 55 06/12/2022   CHOLHDL 2.9 06/12/2022   Lab Results  Component Value Date   VD25OH 40.8 08/26/2022   VD25OH 7.7 09/05/2015   Lab Results  Component Value Date   WBC 6.1 06/12/2022   HGB 13.2 06/12/2022   HCT 39.1 06/12/2022    MCV 92 06/12/2022   PLT 217 06/12/2022   No results found for: "IRON", "TIBC", "FERRITIN"  Attestation Statements:   Reviewed by clinician on day of visit: allergies, medications, problem list, medical history, surgical history, family history, social history, and previous encounter notes.   I, Burt Knack, am acting as transcriptionist for Reuben Likes, MD.  I have reviewed the above documentation for accuracy and completeness, and I agree with the above. - Reuben Likes, MD

## 2023-01-06 ENCOUNTER — Encounter: Payer: Self-pay | Admitting: Internal Medicine

## 2023-01-06 ENCOUNTER — Ambulatory Visit: Payer: 59 | Admitting: Internal Medicine

## 2023-01-06 VITALS — BP 118/76 | HR 50 | Temp 98.5°F | Ht 69.0 in | Wt 286.4 lb

## 2023-01-06 DIAGNOSIS — R7309 Other abnormal glucose: Secondary | ICD-10-CM | POA: Diagnosis not present

## 2023-01-06 DIAGNOSIS — Z23 Encounter for immunization: Secondary | ICD-10-CM

## 2023-01-06 DIAGNOSIS — E66813 Obesity, class 3: Secondary | ICD-10-CM | POA: Diagnosis not present

## 2023-01-06 DIAGNOSIS — Z6841 Body Mass Index (BMI) 40.0 and over, adult: Secondary | ICD-10-CM | POA: Diagnosis not present

## 2023-01-06 MED ORDER — MONTELUKAST SODIUM 10 MG PO TABS: 10.0000 mg | ORAL_TABLET | Freq: Every day | ORAL | 2 refills | Status: DC

## 2023-01-06 NOTE — Patient Instructions (Signed)

## 2023-01-06 NOTE — Progress Notes (Signed)
cI,[name], CMA,acting as a Neurosurgeon for Gwynneth Aliment, MD.,have documented all relevant documentation on the behalf of Gwynneth Aliment, MD,as directed by  Gwynneth Aliment, MD while in the presence of Gwynneth Aliment, MD.  Subjective:  Patient ID: Katherine Conway , female    DOB: 11-21-1970 , 52 y.o.   MRN: [id number]  Chief Complaint  Patient presents with   Weight Check    HPI  Patient presents today for a weight check. She is still followed by [facility].  She denies having any headaches, chest pain and shortness of breath.    Letter sent to GYN for mammogram result.          Past Medical History:  Diagnosis Date   Allergy    Edema of optic nerve    Osteoarthritis    Sleep apnea    Vitamin D deficiency      Family History  Problem Relation Age of Onset   Hypertension Mother    Obesity Mother    Obesity Father    Hyperlipidemia Father    Hypertension Father    Hypertension Maternal Grandmother    Hypertension Maternal Grandfather    Hypertension Paternal Grandmother    Heart disease Paternal Grandfather    Hypertension Paternal Grandfather    Breast cancer Maternal Aunt      Current Outpatient Medications:    cetirizine (ZYRTEC) 10 MG tablet, , Disp: , Rfl:    Nutritional Supplements (NUTRITIONAL SUPPLEMENT PO), Take by mouth. Blood Sugar Focus, Disp: , Rfl:    Nutritional Supplements (NUTRITIONAL SUPPLEMENT PO), Take by mouth. Liver Focus, Disp: , Rfl:    NUTRITIONAL SUPPLEMENTS PO, Take by mouth. Enzymes, Disp: , Rfl:    Turmeric (QC TUMERIC COMPLEX PO), Take by mouth. Prana Tumeric and Ginger Extract, Disp: , Rfl:    VITAMIN D PO, Take 1,000 Units by mouth daily., Disp: , Rfl:    montelukast (SINGULAIR) 10 MG tablet, Take 1 tablet (10 mg total) by mouth daily., Disp: 90 tablet, Rfl: 2   No Known Allergies   Review of Systems  Constitutional: Negative.   Respiratory: Negative.    Cardiovascular: Negative.   Gastrointestinal: Negative.    Neurological: Negative.   Psychiatric/Behavioral: Negative.       Today's Vitals   01/06/23 1611  BP: 118/76  Pulse: (!) 50  Temp: 98.5 F (36.9 C)  SpO2: 98%  Weight: 286 lb 6.4 oz (129.9 kg)  Height: 5\' 9"  (1.753 m)   Body mass index is 42.29 kg/m.  Wt Readings from Last 3 Encounters:  01/06/23 286 lb 6.4 oz (129.9 kg)  01/05/23 286 lb (129.7 kg)  12/11/22 286 lb (129.7 kg)     Objective:  Physical Exam Vitals and nursing note reviewed.  Constitutional:      Appearance: Normal appearance.  HENT:     Head: Normocephalic and atraumatic.  Eyes:     Extraocular Movements: Extraocular movements intact.  Cardiovascular:     Rate and Rhythm: Normal rate and regular rhythm.     Heart sounds: Normal heart sounds.  Pulmonary:     Effort: Pulmonary effort is normal.     Breath sounds: Normal breath sounds.  Musculoskeletal:     Cervical back: Normal range of motion.  Skin:    General: Skin is warm.  Neurological:     General: No focal deficit present.     Mental Status: She is alert.  Psychiatric:  Mood and Affect: Mood normal.        Behavior: Behavior normal.         Assessment And Plan:  Class 3 severe obesity due to excess calories without serious comorbidity with body mass index (BMI) of 40.0 to 44.9 in adult [facility]) Assessment & Plan: Currently followed at [facility] clinic and reports following her assigned meal plan. Encouraged to incorporate at least two days of strength training per week, aiming for at least 150 minutes of exercise per week.    Other abnormal glucose Assessment & Plan: Previous labs reviewed, her A1c has been elevated in the past. I will check an A1c today. Reminded to avoid refined sugars including sugary drinks/foods and processed meats including bacon, sausages and deli meats.    Orders: -     Hemoglobin A1c -     BMP8+eGFR  Immunization due -     Flu vaccine trivalent PF, 6mos and older(Flulaval,Afluria,Fluarix,Fluzone) -      Pfizer Comirnaty Covid-19 Vaccine 40yrs & older  Other orders -     Montelukast Sodium; Take 1 tablet (10 mg total) by mouth daily.  Dispense: 90 tablet; Refill: 2     Return if symptoms worsen or fail to improve.  Patient was given opportunity to ask questions. Patient verbalized understanding of the plan and was able to repeat key elements of the plan. All questions were answered to their satisfaction.    I, Gwynneth Aliment, MD, have reviewed all documentation for this visit. The documentation on 01/10/23 for the exam, diagnosis, procedures, and orders are all accurate and complete.   IF YOU HAVE BEEN REFERRED TO A SPECIALIST, IT MAY TAKE 1-2 WEEKS TO SCHEDULE/PROCESS THE REFERRAL. IF YOU HAVE NOT HEARD FROM US/SPECIALIST IN TWO WEEKS, PLEASE GIVE [place] A CALL AT [phone] X [phone].   THE PATIENT IS ENCOURAGED TO PRACTICE SOCIAL DISTANCING DUE TO THE COVID-19 PANDEMIC.

## 2023-01-07 LAB — BMP8+EGFR
BUN/Creatinine Ratio: 27 — ABNORMAL HIGH (ref 9–23)
BUN: 25 mg/dL — ABNORMAL HIGH (ref 6–24)
CO2: 24 mmol/L (ref 20–29)
Calcium: 9.3 mg/dL (ref 8.7–10.2)
Chloride: 103 mmol/L (ref 96–106)
Creatinine, Ser: 0.93 mg/dL (ref 0.57–1.00)
Glucose: 87 mg/dL (ref 70–99)
Potassium: 4.4 mmol/L (ref 3.5–5.2)
Sodium: 142 mmol/L (ref 134–144)
eGFR: 74 mL/min/{1.73_m2} (ref >59)

## 2023-01-07 LAB — HEMOGLOBIN A1C
Est. average glucose Bld gHb Est-mCnc: 111 mg/dL
Hgb A1c MFr Bld: 5.5 % (ref 4.8–5.6)

## 2023-01-10 DIAGNOSIS — R7309 Other abnormal glucose: Secondary | ICD-10-CM | POA: Insufficient documentation

## 2023-01-10 NOTE — Assessment & Plan Note (Signed)
Currently followed at [facility] clinic and reports following her assigned meal plan. Encouraged to incorporate at least two days of strength training per week, aiming for at least 150 minutes of exercise per week.

## 2023-01-10 NOTE — Assessment & Plan Note (Signed)
Previous labs reviewed, her A1c has been elevated in the past. I will check an A1c today. Reminded to avoid refined sugars including sugary drinks/foods and processed meats including bacon, sausages and deli meats.  

## 2023-01-13 ENCOUNTER — Ambulatory Visit: Payer: 59 | Admitting: Sports Medicine

## 2023-01-13 VITALS — BP 118/80 | HR 68 | Ht 69.0 in | Wt 286.0 lb

## 2023-01-13 DIAGNOSIS — M25511 Pain in right shoulder: Secondary | ICD-10-CM

## 2023-01-13 DIAGNOSIS — M7551 Bursitis of right shoulder: Secondary | ICD-10-CM | POA: Diagnosis not present

## 2023-01-13 MED ORDER — MELOXICAM 15 MG PO TABS: 15.0000 mg | ORAL_TABLET | Freq: Every day | ORAL | 0 refills | Status: DC

## 2023-01-13 NOTE — Patient Instructions (Signed)
- 

## 2023-01-13 NOTE — Progress Notes (Signed)
[facility] [address] [place] [address] Phone: [phone]   Assessment and Plan:     1. Acute pain of right shoulder 2. Subacromial bursitis of right shoulder joint  -Acute, uncomplicated, initial sports medicine visit - Most consistent with subacromial bursitis occurring over the past 4 weeks likely related to increased physical activity at the gym - Start meloxicam 15 mg daily x2 weeks.  If still having pain after 2 weeks, complete 3rd-week of meloxicam. May use remaining meloxicam as needed once daily for pain control.  Do not to use additional NSAIDs while taking meloxicam.  May use Tylenol [phone] mg 2 to 3 times a day for breakthrough pain. - Start HEP for rotator cuff.  Handout provided -Recommend gradual return to physical activity.  Start activity at 50% (speed, duration, reps, sets, intensity) and allow 24 hours to assess for worsening pain.  If 50% is well-tolerated, may increase next activity to 75%.  If 75% is well-tolerated, may increase next physical activity to 100%.  If any of these levels cause pain, recommend dropping down to previous level for an additional 2-3 attempts before advancing.  15 additional minutes spent for educating Therapeutic Home Exercise Program.  This included exercises focusing on stretching, strengthening, with focus on eccentric aspects.   Long term goals include an improvement in range of motion, strength, endurance as well as avoiding reinjury. Patient's frequency would include in 1-2 times a day, 3-5 times a week for a duration of 6-12 weeks. Proper technique shown and discussed handout in great detail with ATC.  All questions were discussed and answered.    Pertinent previous records reviewed include none   Follow Up: 4 weeks for reevaluation.  If no improvement or worsening of symptoms, would obtain x-ray and could consider subacromial CSI versus physical therapy versus ultrasound    Subjective:   I, Katherine Conway, am serving as a Neurosurgeon for Doctor Katherine Conway  Chief Complaint: right arm pain   HPI:   01/13/23 Patient is a 52 year old female complaining of right arm pain. Patient states that she goes to the gym and has been having arm pain. Pain for about a month that has increased over the past couple of weeks .Decrease ROM , decreased grip strength, pain down to the elbow. No meds.   Relevant Historical Information: None pertinent  Additional pertinent review of systems negative.   Current Outpatient Medications:    cetirizine (ZYRTEC) 10 MG tablet, , Disp: , Rfl:    meloxicam (MOBIC) 15 MG tablet, Take 1 tablet (15 mg total) by mouth daily., Disp: 30 tablet, Rfl: 0   montelukast (SINGULAIR) 10 MG tablet, Take 1 tablet (10 mg total) by mouth daily., Disp: 90 tablet, Rfl: 2   Nutritional Supplements (NUTRITIONAL SUPPLEMENT PO), Take by mouth. Blood Sugar Focus, Disp: , Rfl:    Nutritional Supplements (NUTRITIONAL SUPPLEMENT PO), Take by mouth. Liver Focus, Disp: , Rfl:    NUTRITIONAL SUPPLEMENTS PO, Take by mouth. Enzymes, Disp: , Rfl:    Turmeric (QC TUMERIC COMPLEX PO), Take by mouth. Prana Tumeric and Ginger Extract, Disp: , Rfl:    VITAMIN D PO, Take 1,000 Units by mouth daily., Disp: , Rfl:    Objective:     Vitals:   01/13/23 1517  BP: 118/80  Pulse: 68  SpO2: 99%  Weight: 286 lb (129.7 kg)  Height: 5\' 9"  (1.753 m)      Body mass index is 42.23  kg/m.    Physical Exam:    Gen: Appears well, nad, nontoxic and pleasant Neuro:sensation intact, strength is 5/5 with df/pf/inv/ev, muscle tone wnl Skin: no suspicious lesion or defmority Psych: A&O, appropriate mood and affect  Right shoulder:  No deformity, swelling or muscle wasting No scapular winging FF 180, abd 180, int 10, ext 90 NTTP over the [place], clavicle, ac, coracoid, biceps groove, humerus, deltoid, trapezius, cervical spine Positive neer, hawkins, empty can, obriens, crossarm,  subscap liftoff, speeds Neg ant drawer, sulcus sign, apprehension Negative Spurling's test bilat FROM of neck    Electronically signed by:  [facility] 3:33 PM 01/13/23

## 2023-01-29 ENCOUNTER — Encounter (INDEPENDENT_AMBULATORY_CARE_PROVIDER_SITE_OTHER): Payer: Self-pay | Admitting: Family Medicine

## 2023-01-29 ENCOUNTER — Ambulatory Visit (INDEPENDENT_AMBULATORY_CARE_PROVIDER_SITE_OTHER): Payer: 59 | Admitting: Family Medicine

## 2023-01-29 VITALS — BP 121/78 | HR 52 | Temp 98.7°F | Ht 69.0 in | Wt 284.0 lb

## 2023-01-29 DIAGNOSIS — E669 Obesity, unspecified: Secondary | ICD-10-CM

## 2023-01-29 DIAGNOSIS — Z6841 Body Mass Index (BMI) 40.0 and over, adult: Secondary | ICD-10-CM

## 2023-01-29 DIAGNOSIS — E559 Vitamin D deficiency, unspecified: Secondary | ICD-10-CM | POA: Diagnosis not present

## 2023-01-29 DIAGNOSIS — R7303 Prediabetes: Secondary | ICD-10-CM

## 2023-01-29 NOTE — Progress Notes (Signed)
Chief Complaint:   OBESITY Katherine Conway is here to discuss her progress with her obesity treatment plan along with follow-up of her obesity related diagnoses. Katherine Conway is on the Category 3 Plan and states she is following her eating plan approximately 90% of the time. Katherine Conway states she is exercising for 45 minutes 3-4 times per week.  Today's visit was #: 9 Starting weight: 305 lbs Starting date: 08/26/2022 Today's weight: 284 lbs Today's date: 01/29/2023 Total lbs lost to date: 21 Total lbs lost since last in-office visit: 2  Interim History: Patient presents for follow up.  Since last appointment she has been mostly home except for 1 trip to [place] and then went to [employer] homecoming last weekend.  Outside of that she has been mostly on plan.  She feels like she hurt her rotator cuff and so has focused on lower body more than upper body exercises. Next month looks busy for her- [place], [place] and possibly [place] travel.  After that she will be home until December. Biggest challenges are when she is not in her normal routine.   Subjective:   1. Prediabetes Patient's last A1c was 5.5 at the beginning of the month.  She is not on medications.  I discussed labs with the patient today.  2. Vitamin D deficiency Patient is on OTC vitamin D.  Her last vitamin D level was 40.8.  She is on OTC vitamin D 1000 IU daily.  Assessment/Plan:   1. Prediabetes Patient will continue her Category 3 meal plan.  We will repeat labs in February 2025.  2. Vitamin D deficiency Patient will continue OTC vitamin D, and we will repeat labs in February 2025.  3. BMI 40.0-44.9, adult (HCC)  4. Obesity with starting BMI of 45.1 [name] is currently in the action stage of change. As such, her goal is to continue with weight loss efforts. She has agreed to the Category 3 Plan.   Exercise goals: All adults should avoid inactivity. Some physical activity is better than none, and adults who participate in any amount of  physical activity gain some health benefits.  Behavioral modification strategies: increasing lean protein intake, meal planning and cooking strategies, keeping healthy foods in the home, and planning for success.  [name] has agreed to follow-up with our clinic in 4 weeks. She was informed of the importance of frequent follow-up visits to maximize her success with intensive lifestyle modifications for her multiple health conditions.   Objective:   Blood pressure 121/78, pulse (!) 52, temperature 98.7 F (37.1 C), height 5\' 9"  (1.753 m), weight 284 lb (128.8 kg), last menstrual period 03/31/2013. Body mass index is 41.94 kg/m.  General: Cooperative, alert, well developed, in no acute distress. HEENT: Conjunctivae and lids unremarkable. Cardiovascular: Regular rhythm.  Lungs: Normal work of breathing. Neurologic: No focal deficits.   Lab Results  Component Value Date   CREATININE 0.93 01/06/2023   BUN 25 (H) 01/06/2023   NA 142 01/06/2023   K 4.4 01/06/2023   CL 103 01/06/2023   CO2 24 01/06/2023   Lab Results  Component Value Date   ALT 26 08/26/2022   AST 25 08/26/2022   ALKPHOS 91 08/26/2022   BILITOT 0.7 08/26/2022   Lab Results  Component Value Date   HGBA1C 5.5 01/06/2023   HGBA1C 5.7 (H) 08/26/2022   HGBA1C 5.3 02/17/2019   HGBA1C 5.4 01/27/2018   HGBA1C 5.9 09/05/2015   Lab Results  Component Value Date   INSULIN 7.9 08/26/2022  INSULIN 16.8 05/21/2021   INSULIN 13.8 01/10/2021   INSULIN 7.7 07/30/2020   Lab Results  Component Value Date   TSH 2.640 06/12/2022   Lab Results  Component Value Date   CHOL 182 06/12/2022   HDL 62 06/12/2022   LDLCALC 109 (H) 06/12/2022   TRIG 55 06/12/2022   CHOLHDL 2.9 06/12/2022   Lab Results  Component Value Date   VD25OH 40.8 08/26/2022   VD25OH 7.7 09/05/2015   Lab Results  Component Value Date   WBC 6.1 06/12/2022   HGB 13.2 06/12/2022   HCT 39.1 06/12/2022   MCV 92 06/12/2022   PLT 217 06/12/2022    No results found for: "IRON", "TIBC", "FERRITIN"  Attestation Statements:   Reviewed by clinician on day of visit: allergies, medications, problem list, medical history, surgical history, family history, social history, and previous encounter notes.   I, Burt Knack, am acting as transcriptionist for Reuben Likes, MD.  I have reviewed the above documentation for accuracy and completeness, and I agree with the above. - Reuben Likes, MD

## 2023-02-09 NOTE — Progress Notes (Unsigned)
    Aleen Sells D.[facility] [address] [place] [address] Phone: [phone]   Assessment and Plan:     There are no diagnoses linked to this encounter.  ***   Pertinent previous records reviewed include ***   Follow Up: ***     Subjective:   I, Maceo Hernan, am serving as a Neurosurgeon for Doctor Richardean Sale   Chief Complaint: right arm pain    HPI:    01/13/23 Patient is a 52 year old female complaining of right arm pain. Patient states that she goes to the gym and has been having arm pain. Pain for about a month that has increased over the past couple of weeks .Decrease ROM , decreased grip strength, pain down to the elbow. No meds.   02/10/2023 Patient states  Relevant Historical Information: None pertinent    Additional pertinent review of systems negative.   Current Outpatient Medications:    cetirizine (ZYRTEC) 10 MG tablet, , Disp: , Rfl:    meloxicam (MOBIC) 15 MG tablet, Take 1 tablet (15 mg total) by mouth daily., Disp: 30 tablet, Rfl: 0   montelukast (SINGULAIR) 10 MG tablet, Take 1 tablet (10 mg total) by mouth daily., Disp: 90 tablet, Rfl: 2   Nutritional Supplements (NUTRITIONAL SUPPLEMENT PO), Take by mouth. Blood Sugar Focus, Disp: , Rfl:    Nutritional Supplements (NUTRITIONAL SUPPLEMENT PO), Take by mouth. Liver Focus, Disp: , Rfl:    NUTRITIONAL SUPPLEMENTS PO, Take by mouth. Enzymes, Disp: , Rfl:    Turmeric (QC TUMERIC COMPLEX PO), Take by mouth. Prana Tumeric and Ginger Extract, Disp: , Rfl:    VITAMIN D PO, Take 1,000 Units by mouth daily., Disp: , Rfl:    Objective:     There were no vitals filed for this visit.    There is no height or weight on file to calculate BMI.    Physical Exam:    ***   Electronically signed by:  Aleen Sells D.[facility] 4:20 PM 02/09/23

## 2023-02-10 ENCOUNTER — Ambulatory Visit: Payer: 59 | Admitting: Sports Medicine

## 2023-02-10 VITALS — BP 132/82 | HR 63 | Ht 69.0 in | Wt 287.0 lb

## 2023-02-10 DIAGNOSIS — M25511 Pain in right shoulder: Secondary | ICD-10-CM

## 2023-02-10 DIAGNOSIS — M7551 Bursitis of right shoulder: Secondary | ICD-10-CM | POA: Diagnosis not present

## 2023-02-10 NOTE — Patient Instructions (Signed)
Restart meloxicam 15 mg daily for 10 days. Then stop meloxicam  Continue HEP  As needed follow up

## 2023-02-12 ENCOUNTER — Encounter: Payer: Self-pay | Admitting: Internal Medicine

## 2023-02-26 ENCOUNTER — Encounter (INDEPENDENT_AMBULATORY_CARE_PROVIDER_SITE_OTHER): Payer: Self-pay

## 2023-03-02 ENCOUNTER — Ambulatory Visit (INDEPENDENT_AMBULATORY_CARE_PROVIDER_SITE_OTHER): Payer: 59 | Admitting: Family Medicine

## 2023-03-04 ENCOUNTER — Ambulatory Visit (INDEPENDENT_AMBULATORY_CARE_PROVIDER_SITE_OTHER): Payer: 59 | Admitting: Family Medicine

## 2023-03-04 ENCOUNTER — Encounter (INDEPENDENT_AMBULATORY_CARE_PROVIDER_SITE_OTHER): Payer: Self-pay | Admitting: Family Medicine

## 2023-03-04 VITALS — BP 108/73 | HR 72 | Temp 98.5°F | Ht 69.0 in | Wt 279.0 lb

## 2023-03-04 DIAGNOSIS — E7849 Other hyperlipidemia: Secondary | ICD-10-CM | POA: Diagnosis not present

## 2023-03-04 DIAGNOSIS — E559 Vitamin D deficiency, unspecified: Secondary | ICD-10-CM | POA: Diagnosis not present

## 2023-03-04 DIAGNOSIS — R7303 Prediabetes: Secondary | ICD-10-CM | POA: Diagnosis not present

## 2023-03-04 DIAGNOSIS — E669 Obesity, unspecified: Secondary | ICD-10-CM | POA: Diagnosis not present

## 2023-03-04 DIAGNOSIS — Z6841 Body Mass Index (BMI) 40.0 and over, adult: Secondary | ICD-10-CM

## 2023-03-04 NOTE — Assessment & Plan Note (Signed)
Last vitamin D level 40.8 in May.  She is on daily OTC.  She has fatigue but not inappropriately so.  Vitamin D level entered today to be done prior to next appointment.

## 2023-03-04 NOTE — Assessment & Plan Note (Signed)
Last A1c done in October and was 5.5.  Insulin level not done at that time.  Will order repeat CMP and Insulin level.  Patient instructed to fast for 8 hours prior to lab draw and get labs done prior to next appointment.

## 2023-03-04 NOTE — Progress Notes (Signed)
SUBJECTIVE:  Chief Complaint: Obesity  Interim History: Patient has been traveling quite a bit- 4 trips in 3 week span.  Her refridgerator died and she was without a fridge for 3 days.  She has tried to stay more consistent the last week to get more consistent nutrition in.  She feels surprised she made some progress.  The next few weeks she has one more trip to [place].  She is home until Christmas after that.  She does go to the beach for a week after Christmas this year. Next week when she is in [place] she is having lunch and breakfast provided.   Katherine Conway is here to discuss her progress with her obesity treatment plan. She is on the Category 3 Plan and states she is following her eating plan approximately 50-60 % of the time. She states she is exercising 40 minutes 2 times per week- she was not able to exercise when traveling.    OBJECTIVE: Visit Diagnoses: Problem List Items Addressed This Visit       Other   Prediabetes    Last A1c done in October and was 5.5.  Insulin level not done at that time.  Will order repeat CMP and Insulin level.  Patient instructed to fast for 8 hours prior to lab draw and get labs done prior to next appointment.      Relevant Orders   Comprehensive metabolic panel   Insulin, random   Vitamin D deficiency - Primary    Last vitamin D level 40.8 in May.  She is on daily OTC.  She has fatigue but not inappropriately so.  Vitamin D level entered today to be done prior to next appointment.      Relevant Orders   VITAMIN D 25 Hydroxy (Vit-D Deficiency, Fractures)   Other hyperlipidemia    Not on statin.  Last FLP from earlier this year.   The 10-year ASCVD risk score (Arnett DK, et al., 2019) is: 1%   Values used to calculate the score:     Age: 52 years     Sex: Female     Is Non-Hispanic African American: Yes     Diabetic: No     Tobacco smoker: No     Systolic Blood Pressure: 108 mmHg     Is BP treated: No     HDL Cholesterol: 62 mg/dL      Total Cholesterol: 182 mg/dL  LDL minimally elevated at 841.  Repeat fasting lipid panel ordered.  Patient instructed to get labs drawn prior to next appointment.      Relevant Orders   Lipid Panel With LDL/HDL Ratio   Other Visit Diagnoses     Obesity with starting BMI of 45.1       BMI 40.0-44.9, adult (HCC)           Vitals Temp: 98.5 F (36.9 C) BP: 108/73 Pulse Rate: 72 SpO2: 98 %   Anthropometric Measurements Height: 5\' 9"  (1.753 m) Weight: 279 lb (126.6 kg) BMI (Calculated): 41.18 Weight at Last Visit: 284 lb Weight Lost Since Last Visit: 5 Weight Gained Since Last Visit: 0 Starting Weight: 305 lb Total Weight Loss (lbs): 26 lb (11.8 kg)   Body Composition  Body Fat %: 47.7 % Fat Mass (lbs): 133 lbs Muscle Mass (lbs): 138.6 lbs Total Body Water (lbs): 95.4 lbs Visceral Fat Rating : 15   Other Clinical Data Today's Visit #: 10 Starting Date: 08/26/22     ASSESSMENT AND PLAN:  Diet:  Katherine Conway is currently in the action stage of change. As such, her goal is to continue with weight loss efforts. She has agreed to Category 3 Plan.  Exercise: Katherine Conway has been instructed that some exercise is better than none for weight loss and overall health benefits.   Behavior Modification:  We discussed the following Behavioral Modification Strategies today: increasing lean protein intake, increasing vegetables, meal planning and cooking strategies, travel eating strategies, holiday eating strategies, and planning for success.   No follow-ups on file.Marland Kitchen She was informed of the importance of frequent follow up visits to maximize her success with intensive lifestyle modifications for her multiple health conditions.  Attestation Statements:   Reviewed by clinician on day of visit: allergies, medications, problem list, medical history, surgical history, family history, social history, and previous encounter notes.   Reuben Likes, MD

## 2023-03-04 NOTE — Assessment & Plan Note (Signed)
Not on statin.  Last FLP from earlier this year.   The 10-year ASCVD risk score (Arnett DK, et al., 2019) is: 1%   Values used to calculate the score:     Age: 52 years     Sex: Female     Is Non-Hispanic African American: Yes     Diabetic: No     Tobacco smoker: No     Systolic Blood Pressure: 108 mmHg     Is BP treated: No     HDL Cholesterol: 62 mg/dL     Total Cholesterol: 182 mg/dL  LDL minimally elevated at 696.  Repeat fasting lipid panel ordered.  Patient instructed to get labs drawn prior to next appointment.

## 2023-03-16 LAB — HM MAMMOGRAPHY

## 2023-03-31 LAB — COMPREHENSIVE METABOLIC PANEL
ALT: 17 [IU]/L (ref 0–32)
AST: 20 [IU]/L (ref 0–40)
Albumin: 4.4 g/dL (ref 3.8–4.9)
Alkaline Phosphatase: 75 [IU]/L (ref 44–121)
BUN/Creatinine Ratio: 28 — ABNORMAL HIGH (ref 9–23)
BUN: 26 mg/dL — ABNORMAL HIGH (ref 6–24)
Bilirubin Total: 0.7 mg/dL (ref 0.0–1.2)
CO2: 24 mmol/L (ref 20–29)
Calcium: 9.5 mg/dL (ref 8.7–10.2)
Chloride: 104 mmol/L (ref 96–106)
Creatinine, Ser: 0.92 mg/dL (ref 0.57–1.00)
Globulin, Total: 2.7 g/dL (ref 1.5–4.5)
Glucose: 78 mg/dL (ref 70–99)
Potassium: 4.7 mmol/L (ref 3.5–5.2)
Sodium: 143 mmol/L (ref 134–144)
Total Protein: 7.1 g/dL (ref 6.0–8.5)
eGFR: 75 mL/min/{1.73_m2} (ref >59)

## 2023-03-31 LAB — LIPID PANEL WITH LDL/HDL RATIO
Cholesterol, Total: 165 mg/dL (ref 100–199)
HDL: 58 mg/dL (ref >39)
LDL Chol Calc (NIH): 99 mg/dL (ref 0–99)
LDL/HDL Ratio: 1.7 {ratio} (ref 0.0–3.2)
Triglycerides: 36 mg/dL (ref 0–149)
VLDL Cholesterol Cal: 8 mg/dL (ref 5–40)

## 2023-03-31 LAB — INSULIN, RANDOM: INSULIN: 5.2 u[IU]/mL (ref 2.6–24.9)

## 2023-03-31 LAB — VITAMIN D 25 HYDROXY (VIT D DEFICIENCY, FRACTURES): Vit D, 25-Hydroxy: 77.1 ng/mL (ref 30.0–100.0)

## 2023-04-15 ENCOUNTER — Ambulatory Visit (INDEPENDENT_AMBULATORY_CARE_PROVIDER_SITE_OTHER): Payer: 59 | Admitting: Family Medicine

## 2023-04-15 ENCOUNTER — Encounter (INDEPENDENT_AMBULATORY_CARE_PROVIDER_SITE_OTHER): Payer: Self-pay | Admitting: Family Medicine

## 2023-04-15 VITALS — BP 120/69 | HR 51 | Temp 98.7°F | Ht 69.0 in | Wt 286.0 lb

## 2023-04-15 DIAGNOSIS — Z6841 Body Mass Index (BMI) 40.0 and over, adult: Secondary | ICD-10-CM

## 2023-04-15 DIAGNOSIS — E7849 Other hyperlipidemia: Secondary | ICD-10-CM | POA: Diagnosis not present

## 2023-04-15 DIAGNOSIS — R7303 Prediabetes: Secondary | ICD-10-CM | POA: Diagnosis not present

## 2023-04-15 DIAGNOSIS — E559 Vitamin D deficiency, unspecified: Secondary | ICD-10-CM

## 2023-04-15 NOTE — Assessment & Plan Note (Signed)
 Last A1c in October now controlled and recent insulin level WNL.  Patient has made significant lifestyle and dietary changes.  She recognizes she was less fastidious over the holidays with her food intake but is already back to following the meal plan.

## 2023-04-15 NOTE — Assessment & Plan Note (Signed)
 Discussed recent labs with patient- LDL improved to now being in the normal range and was 99.  Her HDL and triglycerides are also both WNL.  Patient to continue dietary and lifestyle changes.

## 2023-04-15 NOTE — Assessment & Plan Note (Signed)
 Discussed recent vitamin d lab result- patient is now at goal.  She will continue OTC supplement.  No nausea, vomiting or muscle weakness noted.

## 2023-04-15 NOTE — Progress Notes (Signed)
 SUBJECTIVE:  Chief Complaint: Obesity  Interim History: Patient had a great holiday season.  She had a great time with family and went to the beach for 10 days and had no schedule or agenda but just resting and relaxing.  She didn't really eat on plan or exercise for the 10 days she was gone.  She really got back on plan 2 days ago. She is home the rest of the month.  She does not have any planned activities or events.   Katherine Conway is here to discuss her progress with her obesity treatment plan. She is on the Category 3 Plan and states she is following her eating plan approximately 75 % of the time. She states she is exercising 45 minutes 4 times per week.   OBJECTIVE: Visit Diagnoses: Problem List Items Addressed This Visit       Other   Prediabetes   Last A1c in October now controlled and recent insulin  level WNL.  Patient has made significant lifestyle and dietary changes.  She recognizes she was less fastidious over the holidays with her food intake but is already back to following the meal plan.      Vitamin D  deficiency   Discussed recent vitamin d  lab result- patient is now at goal.  She will continue OTC supplement.  No nausea, vomiting or muscle weakness noted.      Other hyperlipidemia - Primary   Discussed recent labs with patient- LDL improved to now being in the normal range and was 99.  Her HDL and triglycerides are also both WNL.  Patient to continue dietary and lifestyle changes.      Other Visit Diagnoses       Obesity with starting BMI of 45.1         BMI 40.0-44.9, adult (HCC)           Vitals Temp: 98.7 F (37.1 C) BP: 120/69 Pulse Rate: (!) 51 SpO2: 100 %   Anthropometric Measurements Height: 5' 9 (1.753 m) Weight: 286 lb (129.7 kg) BMI (Calculated): 42.22 Weight at Last Visit: 279 lb Weight Lost Since Last Visit: 0 Weight Gained Since Last Visit: 7 Starting Weight: 305 lb Total Weight Loss (lbs): 19 lb (8.618 kg)   Body Composition  Body  Fat %: 49.4 % Fat Mass (lbs): 141.4 lbs Muscle Mass (lbs): 137.8 lbs Total Body Water (lbs): 98.2 lbs Visceral Fat Rating : 16   Other Clinical Data Today's Visit #: 11 Starting Date: 08/26/22     ASSESSMENT AND PLAN:  Diet: Katherine Conway is currently in the action stage of change. As such, her goal is to continue with weight loss efforts. She has agreed to Category 3 Plan.  Exercise: Katherine Conway has been instructed that some exercise is better than none for weight loss and overall health benefits.  Patient is going to start going back to the gym with the understanding that her first goal is to get back more consistently onto her meal plan.   Behavior Modification:  We discussed the following Behavioral Modification Strategies today: increasing lean protein intake, increasing vegetables, meal planning and cooking strategies, avoiding temptations, and planning for success.   No follow-ups on file.SABRA She was informed of the importance of frequent follow up visits to maximize her success with intensive lifestyle modifications for her multiple health conditions.  Attestation Statements:   Reviewed by clinician on day of visit: allergies, medications, problem list, medical history, surgical history, family history, social history, and previous encounter notes.  [name], MD

## 2023-04-16 ENCOUNTER — Ambulatory Visit: Payer: 59 | Admitting: Internal Medicine

## 2023-04-16 ENCOUNTER — Encounter: Payer: Self-pay | Admitting: Internal Medicine

## 2023-04-16 VITALS — BP 118/80 | HR 61 | Temp 98.3°F | Ht 69.0 in | Wt 287.8 lb

## 2023-04-16 DIAGNOSIS — E66813 Obesity, class 3: Secondary | ICD-10-CM | POA: Diagnosis not present

## 2023-04-16 DIAGNOSIS — Z6841 Body Mass Index (BMI) 40.0 and over, adult: Secondary | ICD-10-CM | POA: Diagnosis not present

## 2023-04-16 NOTE — Patient Instructions (Signed)

## 2023-04-16 NOTE — Progress Notes (Signed)
 I,Katherine Conway, CMA,acting as a neurosurgeon for Katherine LOISE Slocumb, MD.,have documented all relevant documentation on the behalf of Katherine LOISE Slocumb, MD,as directed by  Katherine LOISE Slocumb, MD while in the presence of Katherine LOISE Slocumb, MD.  Subjective:  Patient ID: Katherine Conway , female    DOB: 05-19-1970 , 53 y.o.   MRN: [id number]  Chief Complaint  Patient presents with   Weight Check    HPI  Patient presents today for a weight check. She is still followed by [facility].  She denies having any headaches, chest pain and shortness of breath.  She has no specific questions or concerns.   Letter sent to Katherine Conway for pap & mammogram.            Past Medical History:  Diagnosis Date   Allergy    Edema of optic nerve    Osteoarthritis    Sleep apnea    Vitamin D  deficiency      Family History  Problem Relation Age of Onset   Hypertension Mother    Obesity Mother    Obesity Father    Hyperlipidemia Father    Hypertension Father    Hypertension Maternal Grandmother    Hypertension Maternal Grandfather    Hypertension Paternal Grandmother    Heart disease Paternal Grandfather    Hypertension Paternal Grandfather    Breast cancer Maternal Aunt      Current Outpatient Medications:    cetirizine (ZYRTEC) 10 MG tablet, , Disp: , Rfl:    meloxicam  (MOBIC ) 15 MG tablet, Take 1 tablet (15 mg total) by mouth daily., Disp: 30 tablet, Rfl: 0   montelukast  (SINGULAIR ) 10 MG tablet, Take 1 tablet (10 mg total) by mouth daily., Disp: 90 tablet, Rfl: 2   Nutritional Supplements (NUTRITIONAL SUPPLEMENT PO), Take by mouth. Blood Sugar Focus, Disp: , Rfl:    Nutritional Supplements (NUTRITIONAL SUPPLEMENT PO), Take by mouth. Liver Focus, Disp: , Rfl:    NUTRITIONAL SUPPLEMENTS PO, Take by mouth. Enzymes, Disp: , Rfl:    Turmeric (QC TUMERIC COMPLEX PO), Take by mouth. Prana Tumeric and Ginger Extract, Disp: , Rfl:    VITAMIN D  PO, Take 1,000 Units by mouth daily., Disp: , Rfl:    No  Known Allergies   Review of Systems  Constitutional: Negative.   Respiratory: Negative.    Cardiovascular: Negative.   Neurological: Negative.   Psychiatric/Behavioral: Negative.       Today's Vitals   04/16/23 1609  BP: 118/80  Pulse: 61  Temp: 98.3 F (36.8 C)  SpO2: 98%  Weight: 287 lb 12.8 oz (130.5 kg)  Height: 5' 9 (1.753 m)   Body mass index is 42.5 kg/m.  Wt Readings from Last 3 Encounters:  04/16/23 287 lb 12.8 oz (130.5 kg)  04/15/23 286 lb (129.7 kg)  03/04/23 279 lb (126.6 kg)    BP Readings from Last 3 Encounters:  04/16/23 118/80  04/15/23 120/69  03/04/23 108/73     Objective:  Physical Exam Vitals and nursing note reviewed.  Constitutional:      Appearance: Normal appearance. She is obese.  HENT:     Head: Normocephalic and atraumatic.  Eyes:     Extraocular Movements: Extraocular movements intact.  Cardiovascular:     Rate and Rhythm: Normal rate and regular rhythm.     Heart sounds: Normal heart sounds.  Pulmonary:     Effort: Pulmonary effort is normal.     Breath sounds: Normal breath sounds.  Musculoskeletal:  Cervical back: Normal range of motion.  Skin:    General: Skin is warm.  Neurological:     General: No focal deficit present.     Mental Status: She is alert.  Psychiatric:        Mood and Affect: Mood normal.        Behavior: Behavior normal.         Assessment And Plan:  Class 3 severe obesity due to excess calories without serious comorbidity with body mass index (BMI) of 40.0 to 44.9 in adult [facility]) Assessment & Plan: BMI 42.  Currently followed at [facility] clinic and reports following her assigned meal plan. Encouraged to incorporate at least two days of strength training per week, aiming for at least 150 minutes of exercise per week. She is now working out with a psychologist, educational.   She is encouraged to strive for BMI less than 30 to decrease cardiac risk. Advised to aim for at least 150 minutes of exercise per week.    Return move March physical to May please.  Patient was given opportunity to ask questions. Patient verbalized understanding of the plan and was able to repeat key elements of the plan. All questions were answered to their satisfaction.    I, Katherine LOISE Slocumb, MD, have reviewed all documentation for this visit. The documentation on 04/16/23 for the exam, diagnosis, procedures, and orders are all accurate and complete.   IF YOU HAVE BEEN REFERRED TO A SPECIALIST, IT MAY TAKE 1-2 WEEKS TO SCHEDULE/PROCESS THE REFERRAL. IF YOU HAVE NOT HEARD FROM US /SPECIALIST IN TWO WEEKS, PLEASE GIVE US  A CALL AT [phone] X [phone].   THE PATIENT IS ENCOURAGED TO PRACTICE SOCIAL DISTANCING DUE TO THE COVID-19 PANDEMIC.

## 2023-04-17 NOTE — Assessment & Plan Note (Signed)
 BMI 42.  Currently followed at [facility] clinic and reports following her assigned meal plan. Encouraged to incorporate at least two days of strength training per week, aiming for at least 150 minutes of exercise per week. She is now working out with a Psychologist, educational.

## 2023-04-27 ENCOUNTER — Encounter: Payer: Self-pay | Admitting: *Deleted

## 2023-04-27 NOTE — Progress Notes (Unsigned)
ESS 5    PATIENT: Katherine Conway DOB: Mar 27, 1971  REASON FOR VISIT: follow up HISTORY FROM: patient PRIMARY NEUROLOGIST: Dr. Vickey Huger  Virtual Visit via Video Note  I connected with Katherine Conway on 04/28/23 at  1:15 PM EST by a video enabled telemedicine application located remotely at [facility] and verified that I am speaking with the correct person using two identifiers who was located at their own home.   I discussed the limitations of evaluation and management by telemedicine and the availability of in person appointments. The patient expressed understanding and agreed to proceed.   PATIENT: Katherine Conway DOB: 17-Oct-1970  REASON FOR VISIT: follow up HISTORY FROM: patient  HISTORY OF PRESENT ILLNESS: Today 04/28/23:  Katherine Conway is a 53 y.o. female with a history of OSA on CPAP. Returns today for follow-up. CPAP machine is working well. Continues to find it beneficial. Denies any new issues. Maybe interested in travel CPAP in the future.      HISTORY   REVIEW OF SYSTEMS: Out of a complete 14 system review of symptoms, the patient complains only of the following symptoms, and all other reviewed systems are negative.  ALLERGIES: No Known Allergies  HOME MEDICATIONS: Outpatient Medications Prior to Visit  Medication Sig Dispense Refill   cetirizine (ZYRTEC) 10 MG tablet      montelukast (SINGULAIR) 10 MG tablet Take 1 tablet (10 mg total) by mouth daily. 90 tablet 2   Nutritional Supplements (NUTRITIONAL SUPPLEMENT PO) Take by mouth. Blood Sugar Focus     Nutritional Supplements (NUTRITIONAL SUPPLEMENT PO) Take by mouth. Liver Focus     NUTRITIONAL SUPPLEMENTS PO Take by mouth. Enzymes     Turmeric (QC TUMERIC COMPLEX PO) Take by mouth. Prana Tumeric and Ginger Extract     VITAMIN D PO Take 1,000 Units by mouth daily.     No facility-administered medications prior to visit.    PAST MEDICAL HISTORY: Past Medical History:   Diagnosis Date   Allergy    Edema of optic nerve    Osteoarthritis    Sleep apnea    Vitamin D deficiency     PAST SURGICAL HISTORY: Past Surgical History:  Procedure Laterality Date   CESAREAN SECTION      FAMILY HISTORY: Family History  Problem Relation Age of Onset   Hypertension Mother    Obesity Mother    Obesity Father    Hyperlipidemia Father    Hypertension Father    Hypertension Maternal Grandmother    Hypertension Maternal Grandfather    Hypertension Paternal Grandmother    Heart disease Paternal Grandfather    Hypertension Paternal Grandfather    Breast cancer Maternal Aunt     SOCIAL HISTORY: Social History   Socioeconomic History   Marital status: Divorced    Spouse name: Not on file   Number of children: Not on file   Years of education: Not on file   Highest education level: Master's degree (e.g., MA, MS, MEng, MEd, MSW, MBA)  Occupational History   Occupation: [employer]   Tobacco Use   Smoking status: Never   Smokeless tobacco: Never  Vaping Use   Vaping status: Never Used  Substance and Sexual Activity   Alcohol use: Yes    Comment: occasional wine    Drug use: No   Sexual activity: Not Currently  Other Topics Concern   Not on file  Social History Narrative   Coffee sometimes    Right handed  Social Drivers of Corporate investment banker Strain: Low Risk  (01/05/2023)   Overall Financial Resource Strain (CARDIA)    Difficulty of Paying Living Expenses: Not hard at all  Food Insecurity: No Food Insecurity (01/05/2023)   Hunger Vital Sign    Worried About Running Out of Food in the Last Year: Never true    Ran Out of Food in the Last Year: Never true  Transportation Needs: No Transportation Needs (01/05/2023)   PRAPARE - Administrator, Civil Service (Medical): No    Lack of Transportation (Non-Medical): No  Physical Activity: Sufficiently Active (01/05/2023)   Exercise Vital Sign    Days of Exercise per Week:  4 days    Minutes of Exercise per Session: 40 min  Stress: No Stress Concern Present (01/05/2023)   [employer] of Occupational Health - Occupational Stress Questionnaire    Feeling of Stress : Only a little  Social Connections: Moderately Integrated (01/05/2023)   Social Connection and Isolation Panel [NHANES]    Frequency of Communication with Friends and Family: More than three times a week    Frequency of Social Gatherings with Friends and Family: Once a week    Attends Religious Services: More than 4 times per year    Active Member of [employer] or Organizations: Yes    Attends Engineer, structural: More than 4 times per year    Marital Status: Divorced  Catering manager Violence: Not on file      PHYSICAL EXAM Generalized: Well developed, in no acute distress   Neurological examination  Mentation: Alert oriented to time, place, history taking. Follows all commands speech and language fluent Cranial nerve II-XII:Facial symmetry noted.   DIAGNOSTIC DATA (LABS, IMAGING, TESTING) - I reviewed patient records, labs, notes, testing and imaging myself where available.  Lab Results  Component Value Date   WBC 6.1 06/12/2022   HGB 13.2 06/12/2022   HCT 39.1 06/12/2022   MCV 92 06/12/2022   PLT 217 06/12/2022      Component Value Date/Time   NA 143 03/30/2023 1353   K 4.7 03/30/2023 1353   CL 104 03/30/2023 1353   CO2 24 03/30/2023 1353   GLUCOSE 78 03/30/2023 1353   BUN 26 (H) 03/30/2023 1353   CREATININE 0.92 03/30/2023 1353   CALCIUM 9.5 03/30/2023 1353   PROT 7.1 03/30/2023 1353   ALBUMIN 4.4 03/30/2023 1353   AST 20 03/30/2023 1353   ALT 17 03/30/2023 1353   ALKPHOS 75 03/30/2023 1353   BILITOT 0.7 03/30/2023 1353   GFRNONAA 73 02/07/2020 0000   GFRAA 85 02/07/2020 0000   Lab Results  Component Value Date   CHOL 165 03/30/2023   HDL 58 03/30/2023   LDLCALC 99 03/30/2023   TRIG 36 03/30/2023   CHOLHDL 2.9 06/12/2022   Lab Results  Component  Value Date   HGBA1C 5.5 01/06/2023   Lab Results  Component Value Date   VITAMINB12 1,230 08/26/2022   Lab Results  Component Value Date   TSH 2.640 06/12/2022      ASSESSMENT AND PLAN 53 y.o. year old female  has a past medical history of Allergy, Edema of optic nerve, Osteoarthritis, Sleep apnea, and Vitamin D deficiency. here with:  OSA on CPAP  CPAP compliance excellent Residual AHI is good Encouraged patient to continue using CPAP nightly and > 4 hours each night F/U in 1 year or sooner if needed    Butch Penny, MSN, NP-C 04/30/2023, 4:18 PM [facility] [address] [place], [place] [address] [phone]

## 2023-04-28 ENCOUNTER — Telehealth: Payer: 59 | Admitting: Adult Health

## 2023-04-28 DIAGNOSIS — G4733 Obstructive sleep apnea (adult) (pediatric): Secondary | ICD-10-CM

## 2023-05-06 ENCOUNTER — Ambulatory Visit (INDEPENDENT_AMBULATORY_CARE_PROVIDER_SITE_OTHER): Payer: 59 | Admitting: Family Medicine

## 2023-05-06 ENCOUNTER — Encounter (INDEPENDENT_AMBULATORY_CARE_PROVIDER_SITE_OTHER): Payer: Self-pay | Admitting: Family Medicine

## 2023-05-06 ENCOUNTER — Encounter (INDEPENDENT_AMBULATORY_CARE_PROVIDER_SITE_OTHER): Payer: Self-pay

## 2023-05-06 ENCOUNTER — Telehealth: Payer: Self-pay | Admitting: *Deleted

## 2023-05-06 VITALS — BP 121/67 | HR 70 | Temp 98.2°F | Ht 69.0 in | Wt 278.0 lb

## 2023-05-06 DIAGNOSIS — G4733 Obstructive sleep apnea (adult) (pediatric): Secondary | ICD-10-CM

## 2023-05-06 DIAGNOSIS — E7849 Other hyperlipidemia: Secondary | ICD-10-CM | POA: Diagnosis not present

## 2023-05-06 DIAGNOSIS — Z6841 Body Mass Index (BMI) 40.0 and over, adult: Secondary | ICD-10-CM

## 2023-05-06 DIAGNOSIS — E66813 Obesity, class 3: Secondary | ICD-10-CM

## 2023-05-06 DIAGNOSIS — E669 Obesity, unspecified: Secondary | ICD-10-CM

## 2023-05-06 MED ORDER — ZEPBOUND 2.5 MG/0.5ML ~~LOC~~ SOAJ
2.5000 mg | SUBCUTANEOUS | 0 refills | Status: DC
Start: 2023-05-06 — End: 2023-05-07

## 2023-05-06 NOTE — Progress Notes (Signed)
SUBJECTIVE:  Chief Complaint: Obesity  Interim History: Patient has been really consistently on plan since the last appointment.  She has been hungry more consistently in the last few weeks.  She is surprised that she was hungry even after eating.  She is doing 8 oz at supper.  She is traveling to [place] next week for a week to [place].  Typically she will do a protein shake and a greek yogurt.  Lunch is ordered and she will get extra protein on salad.  Dinner is variable- she will eat but its not consistent and will be at [employer].   Katherine Conway is here to discuss her progress with her obesity treatment plan. She is on the Category 3 Plan and states she is following her eating plan approximately 90 % of the time. She states she is exercising 40 minutes 3 times per week- cardio strength with ropes, crunches, etc with a personal trainer.   OBJECTIVE: Visit Diagnoses: Problem List Items Addressed This Visit       Respiratory   OSA on CPAP - Primary   Overall AHI was 24.5/h for this HST, placing the patient in the moderate category of OSA. REM AHI was significantly higher, at AHI of 49/h. 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 Hypoxemia for 25 minutes was also present- from sleep study August 2021  We discussed FDA approval of Zepbound for OSA currently. She is interested in pursuing this option.  We discussed risks and benefits and possible side effects. Prescription sent in for Zepbound 2.5mg  weekly.        Other   Other hyperlipidemia   The 10-year ASCVD risk score (Arnett DK, et al., 2019) is: 1.5%   Values used to calculate the score:     Age: 53 years     Sex: Female     Is Non-Hispanic African American: Yes     Diabetic: No     Tobacco smoker: No     Systolic Blood Pressure: 121 mmHg     Is BP treated: No     HDL Cholesterol: 58 mg/dL     Total Cholesterol: 165 mg/dL  Lower risk.  Last LDL better controlled with lifestyle changes.   Repeat labs in  3-4 months.      Other Visit Diagnoses       Obesity, starting BMI 45.04         BMI 40.0-44.9, adult (HCC)          Body mass index is 41.05 kg/m.    05/06/2023    9:00 AM 04/16/2023    4:09 PM 04/15/2023    2:00 PM  Vitals with BMI  Height 5\' 9"  5\' 9"  5\' 9"   Weight 278 lbs 287 lbs 13 oz 286 lbs  BMI 41.03 42.48 42.22  Systolic 121 118 409  Diastolic 67 80 69  Pulse 70 61 51    No data recorded  No data recorded  No data recorded  No data recorded    ASSESSMENT AND PLAN:  Diet: Katherine Conway is currently in the action stage of change. As such, her goal is to continue with weight loss efforts. She has agreed to Category 3 Plan- patient to go up to 10oz at supper and if still hungry after a few days increase by an extra 100 calories for snack.   Exercise: Katherine Conway has been instructed that some exercise is better than none for weight loss and overall health benefits.  Continue physical activity  as able next week when she is traveling.   Behavior Modification:  We discussed the following Behavioral Modification Strategies today: increasing lean protein intake, increasing vegetables, meal planning and cooking strategies, and travel eating strategies.   No follow-ups on file.Marland Kitchen She was informed of the importance of frequent follow up visits to maximize her success with intensive lifestyle modifications for her multiple health conditions.  Attestation Statements:   Reviewed by clinician on day of visit: allergies, medications, problem list, medical history, surgical history, family history, social history, and previous encounter notes.    Reuben Likes, MD

## 2023-05-06 NOTE — Assessment & Plan Note (Signed)
Overall AHI was 24.5/h for this HST, placing the patient in the moderate category of OSA. REM AHI was significantly higher, at AHI of 49/h. 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 Hypoxemia for 25 minutes was also present- from sleep study August 2021  We discussed FDA approval of Zepbound for OSA currently. She is interested in pursuing this option.  We discussed risks and benefits and possible side effects. Prescription sent in for Zepbound 2.5mg  weekly.

## 2023-05-06 NOTE — Telephone Encounter (Signed)
Prior authorization done via cover my meds for patients Zepbound. Waiting on determination.

## 2023-05-06 NOTE — Assessment & Plan Note (Signed)
The 10-year ASCVD risk score (Arnett DK, et al., 2019) is: 1.5%   Values used to calculate the score:     Age: 53 years     Sex: Female     Is Non-Hispanic African American: Yes     Diabetic: No     Tobacco smoker: No     Systolic Blood Pressure: 121 mmHg     Is BP treated: No     HDL Cholesterol: 58 mg/dL     Total Cholesterol: 165 mg/dL  Lower risk.  Last LDL better controlled with lifestyle changes.   Repeat labs in 3-4 months.

## 2023-05-06 NOTE — Telephone Encounter (Signed)
Prior authorization got an instant approval for Zepbound.  Approved today by [facility] NCP[facility] 2017 Your PA request has been approved. Additional information will be provided in the approval communication. (Message [id number]) Authorization Expiration Date: 01/01/2024

## 2023-05-07 ENCOUNTER — Other Ambulatory Visit (INDEPENDENT_AMBULATORY_CARE_PROVIDER_SITE_OTHER): Payer: Self-pay

## 2023-05-07 ENCOUNTER — Other Ambulatory Visit (HOSPITAL_COMMUNITY): Payer: Self-pay

## 2023-05-07 DIAGNOSIS — G4733 Obstructive sleep apnea (adult) (pediatric): Secondary | ICD-10-CM

## 2023-05-07 MED ORDER — ZEPBOUND 2.5 MG/0.5ML ~~LOC~~ SOAJ
2.5000 mg | SUBCUTANEOUS | 0 refills | Status: DC
Start: 2023-05-07 — End: 2023-06-03
  Filled 2023-05-07: qty 2, 28d supply, fill #0

## 2023-05-29 ENCOUNTER — Other Ambulatory Visit (INDEPENDENT_AMBULATORY_CARE_PROVIDER_SITE_OTHER): Payer: Self-pay | Admitting: Family Medicine

## 2023-05-29 DIAGNOSIS — G4733 Obstructive sleep apnea (adult) (pediatric): Secondary | ICD-10-CM

## 2023-06-01 ENCOUNTER — Other Ambulatory Visit (HOSPITAL_COMMUNITY): Payer: Self-pay

## 2023-06-01 ENCOUNTER — Encounter (HOSPITAL_COMMUNITY): Payer: Self-pay

## 2023-06-03 ENCOUNTER — Ambulatory Visit (INDEPENDENT_AMBULATORY_CARE_PROVIDER_SITE_OTHER): Payer: 59 | Admitting: Family Medicine

## 2023-06-03 ENCOUNTER — Encounter (INDEPENDENT_AMBULATORY_CARE_PROVIDER_SITE_OTHER): Payer: Self-pay | Admitting: Family Medicine

## 2023-06-03 ENCOUNTER — Other Ambulatory Visit (HOSPITAL_COMMUNITY): Payer: Self-pay

## 2023-06-03 VITALS — BP 105/73 | HR 59 | Temp 98.1°F | Ht 69.0 in | Wt 282.0 lb

## 2023-06-03 DIAGNOSIS — E66813 Obesity, class 3: Secondary | ICD-10-CM | POA: Diagnosis not present

## 2023-06-03 DIAGNOSIS — Z6841 Body Mass Index (BMI) 40.0 and over, adult: Secondary | ICD-10-CM

## 2023-06-03 DIAGNOSIS — J302 Other seasonal allergic rhinitis: Secondary | ICD-10-CM | POA: Diagnosis not present

## 2023-06-03 DIAGNOSIS — G4733 Obstructive sleep apnea (adult) (pediatric): Secondary | ICD-10-CM

## 2023-06-03 MED ORDER — ZEPBOUND 2.5 MG/0.5ML ~~LOC~~ SOAJ
2.5000 mg | SUBCUTANEOUS | 0 refills | Status: DC
Start: 2023-06-03 — End: 2023-06-23
  Filled 2023-06-03 (×2): qty 2, 28d supply, fill #0

## 2023-06-03 NOTE — Progress Notes (Signed)
 SUBJECTIVE:  Chief Complaint: Obesity  Interim History: Work has been very busy and her schedule has been out of sorts.  She has been helping her uncle (husband of the aunt that passed) with arrangements and planning. She also hasn't been able to go to the gym as much as she normally does and hasn't been eating on her normal routine or schedule.  Her upcoming few weeks seem to less hectic.   Katherine Conway is here to discuss her progress with her obesity treatment plan. She is on the Category 3 Plan and states she is following her eating plan approximately 30 % of the time. She states she is exercising 40 minutes 1 time per week.   OBJECTIVE: Visit Diagnoses: Problem List Items Addressed This Visit       Respiratory   OSA on CPAP - Primary   Doing well on Zepbound.  She is still using her CPAP.  Eating slower but no GI side effects.  Continue current med at current dose.      Relevant Medications   tirzepatide (ZEPBOUND) 2.5 MG/0.5ML Pen     Other   Class 3 severe obesity due to excess calories without serious comorbidity with body mass index (BMI) of 40.0 to 44.9 in adult [facility])   Anthropometric Measurements Height: 5\' 9"  (1.753 m) Weight: 282 lb (127.9 kg) BMI (Calculated): 41.63 Weight at Last Visit: 278 lb Weight Lost Since Last Visit: 0 Weight Gained Since Last Visit: 4 Starting Weight: 305 lb Total Weight Loss (lbs): 23 lb (10.4 kg)  Other Clinical Data Today's Visit #: 13 Starting Date: 08/26/22  Now on Zepbound for sleep apnea.  Going to continue Category 3 plan.       Relevant Medications   tirzepatide (ZEPBOUND) 2.5 MG/0.5ML Pen   Seasonal allergies   She is on singulair and zyrtec daily.  She denies any symptoms currently but we discussed addition of flonase if symptoms started are not well managed.      Other Visit Diagnoses       BMI 40.0-44.9, adult (HCC)       Relevant Medications   tirzepatide (ZEPBOUND) 2.5 MG/0.5ML Pen       Vitals Temp: 98.1  F (36.7 C) BP: 105/73 Pulse Rate: (!) 59 SpO2: 100 %    Body Composition  Body Fat %: 49.2 % Fat Mass (lbs): 138.8 lbs Muscle Mass (lbs): 136.2 lbs Total Body Water (lbs): 97.4 lbs Visceral Fat Rating : 15      ASSESSMENT AND PLAN:  Diet: Katherine Conway is currently in the action stage of change. As such, her goal is to continue with weight loss efforts and has agreed to the Category 3 Plan.   Exercise:  For substantial health benefits, adults should do at least 150 minutes (2 hours and 30 minutes) a week of moderate-intensity, or 75 minutes (1 hour and 15 minutes) a week of vigorous-intensity aerobic physical activity, or an equivalent combination of moderate- and vigorous-intensity aerobic activity. Aerobic activity should be performed in episodes of at least 10 minutes, and preferably, it should be spread throughout the week.  Behavior Modification:  We discussed the following Behavioral Modification Strategies today: increasing lean protein intake, increasing vegetables, no skipping meals, and planning for success.   No follow-ups on file.Marland Kitchen She was informed of the importance of frequent follow up visits to maximize her success with intensive lifestyle modifications for her multiple health conditions.  Attestation Statements:   Reviewed by clinician on day of visit: allergies, medications,  problem list, medical history, surgical history, family history, social history, and previous encounter notes.     Reuben Likes, MD

## 2023-06-03 NOTE — Assessment & Plan Note (Signed)
 Anthropometric Measurements Height: 5\' 9"  (1.753 m) Weight: 282 lb (127.9 kg) BMI (Calculated): 41.63 Weight at Last Visit: 278 lb Weight Lost Since Last Visit: 0 Weight Gained Since Last Visit: 4 Starting Weight: 305 lb Total Weight Loss (lbs): 23 lb (10.4 kg)  Other Clinical Data Today's Visit #: 13 Starting Date: 08/26/22  Now on Zepbound for sleep apnea.  Going to continue Category 3 plan.

## 2023-06-03 NOTE — Assessment & Plan Note (Signed)
 Doing well on Zepbound.  She is still using her CPAP.  Eating slower but no GI side effects.  Continue current med at current dose.

## 2023-06-03 NOTE — Assessment & Plan Note (Signed)
 She is on singulair and zyrtec daily.  She denies any symptoms currently but we discussed addition of flonase if symptoms started are not well managed.

## 2023-06-15 ENCOUNTER — Encounter: Payer: Self-pay | Admitting: Internal Medicine

## 2023-06-22 NOTE — Progress Notes (Unsigned)
 SUBJECTIVE: Discussed the use of AI scribe software for clinical note transcription with the patient, who gave verbal consent to proceed.  Chief Complaint: Obesity  Interim History: She maintained her weight since her last visit.   Katherine Conway is here to discuss her progress with her obesity treatment plan. She is on the Category 3 Plan and states she is following her eating plan approximately 70 % of the time. She states she is exercising cardio/strengthening 40 minutes 3 times per week. Katherine Conway is a 53 year old female who presents for a follow-up on her obesity treatment plan.  She has been on Zepbound at a dose of 2.5 mg, having received six or seven doses, with no side effects such as nausea, vomiting, diarrhea, constipation, difficulty swallowing, vision changes, or mood changes. She feels satisfied after eating according to her plan and experiences fewer cravings since starting the medication. However, stress-related cravings remain challenging, exacerbated by recent personal and professional stressors, including a death in the family and job-related travel.  She is currently taking vitamin D over the counter at a dose of 1000 units daily, as recommended by her primary care provider. She also manages her seasonal allergies with Zyrtec at night and Singulair in the morning, which she has continued since COVID-19, finding it necessary year-round. No issues with these medications are reported.  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 She travels frequently for work, with upcoming trips to [place], [place], and [place], [place]. She notes that her routine is more predictable when she is home, which helps her manage her eating habits better.  OBJECTIVE: Visit Diagnoses: Problem List Items Addressed This Visit     OSA on CPAP - Primary   Relevant Medications   tirzepatide (ZEPBOUND) 5  MG/0.5ML Pen   Prediabetes   Vitamin D deficiency   Seasonal allergies   Other Visit Diagnoses       Obesity with starting BMI of 45.1       Relevant Medications   tirzepatide (ZEPBOUND) 5 MG/0.5ML Pen     Obesity She is undergoing treatment for obesity and is taking Zepbound 2.5 mg weekly for primary indication of sleep apnea.  After six or seven doses, she reports no significant side effects such as nausea, vomiting, diarrhea, constipation, difficulty swallowing, vision changes, or mood changes.  She feels satisfied after eating and experiences fewer cravings, often linked to stress. Recent stressors, including a family death and work-related stress, have made management challenging. The decision was made to increase the semaglutide dose to better manage cravings and improve her condition. She was informed about potential side effects of the increased dosage, including nausea and vomiting, and advised on dietary adjustments to mitigate these effects. Discussed the option of skipping a dose if necessary during travel, with plans to resume upon return. - Increase Zepbound dose for primary indication of OSA. - Advise monitoring for nausea and vomiting, and to consume protein-based meals while avoiding excessively fatty foods. - Encourage hydration, especially during travel. - Continue monitoring weight and muscle maintenance.   OSA using CPAP On Zepbound for primary indication of sleep apnea.  Good compliance with CPAP.  Plan:  Intensive lifestyle modifications are the first line treatment for this issue. We discussed several lifestyle modifications today and she will continue to work on diet, exercise and weight loss efforts. We will continue to monitor. Orders and follow up as documented in  patient record.  Continue and increase Zepbound Meds ordered this encounter  Medications   tirzepatide (ZEPBOUND) 5 MG/0.5ML Pen    Sig: Inject 5 mg into the skin once a week.    Dispense:  2 mL     Refill:  0   Prediabetes Last A1c was 5.5- at goal . Insulin 5.2- at goal  Medication(s): Zepbound 2.5 mg SQ weekly Polyphagia:No Lab Results  Component Value Date   HGBA1C 5.5 01/06/2023   HGBA1C 5.7 (H) 08/26/2022   HGBA1C 5.3 02/17/2019   HGBA1C 5.4 01/27/2018   HGBA1C 5.9 09/05/2015   Lab Results  Component Value Date   INSULIN 5.2 03/30/2023   INSULIN 7.9 08/26/2022   INSULIN 16.8 05/21/2021   INSULIN 13.8 01/10/2021   INSULIN 7.7 07/30/2020    Plan: Continue and increase dose Zepbound 5.0 mg SQ weekly Continue working on nutrition plan to decrease simple carbohydrates, increase lean proteins and exercise to promote weight loss, improve glycemic control and prevent progression to Type 2 diabetes.    Seasonal allergies Her seasonal allergies are well-controlled with Zyrtec and Singulair. She takes Zyrtec at night due to its sedative effects and Singulair in the morning. - Continue Zyrtec and Singulair as currently prescribed.  Vitamin D supplementation She is taking over-the-counter vitamin D at 1000 units daily without issues. Last vitamin D Lab Results  Component Value Date   VD25OH 77.1 03/30/2023   Low vitamin D levels can be associated with adiposity and may result in leptin resistance and weight gain. Also associated with fatigue.  Currently on vitamin D supplementation without any adverse effects such as nausea, vomiting or muscle weakness.  - Continue vitamin D supplementation at 1000 units daily.  General Health Maintenance She maintains cardiovascular health through regular cardio and strengthening exercises and manages stress, which has been elevated due to personal and work-related issues. - Encourage continuation of regular exercise regimen. - Advise stress management techniques and support.  Vitals Temp: 98.2 F (36.8 C) BP: 130/85 Pulse Rate: (!) 59 SpO2: 96 %   Anthropometric Measurements Height: 5\' 9"  (1.753 m) Weight: 282 lb (127.9  kg) BMI (Calculated): 41.63 Weight at Last Visit: 282 lb Weight Lost Since Last Visit: 0 Weight Gained Since Last Visit: 0 Starting Weight: 305 lb Total Weight Loss (lbs): 23 lb (10.4 kg)   Body Composition  Body Fat %: 49.3 % Fat Mass (lbs): 139.2 lbs Muscle Mass (lbs): 136 lbs Total Body Water (lbs): 98.8 lbs Visceral Fat Rating : 15   Other Clinical Data Fasting: no Labs: no Today's Visit #: 14 Starting Date: 08/26/22     ASSESSMENT AND PLAN:  Diet: Shelbey is currently in the action stage of change. As such, her goal is to continue with weight loss efforts and has agreed to the Category 3 Plan.   Exercise:  For substantial health benefits, adults should do at least 150 minutes (2 hours and 30 minutes) a week of moderate-intensity, or 75 minutes (1 hour and 15 minutes) a week of vigorous-intensity aerobic physical activity, or an equivalent combination of moderate- and vigorous-intensity aerobic activity. Aerobic activity should be performed in episodes of at least 10 minutes, and preferably, it should be spread throughout the week. and Adults should also include muscle-strengthening activities that involve all major muscle groups on 2 or more days a week.  Behavior Modification:  We discussed the following Behavioral Modification Strategies today: increasing lean protein intake, decreasing simple carbohydrates, increasing vegetables, increase H2O intake, increase high fiber foods,  no skipping meals, meal planning and cooking strategies, travel eating strategies, avoiding temptations, and planning for success. We discussed various medication options to help [place] with her weight loss efforts and we both agreed to increase zepbound to 5 mg weekly for primary indication of sleep apnea to promote medical weight loss.  Return in about 4 weeks (around 07/21/2023).Marland Kitchen She was informed of the importance of frequent follow up visits to maximize her success with intensive lifestyle  modifications for her multiple health conditions.  Attestation Statements:   Reviewed by clinician on day of visit: allergies, medications, problem list, medical history, surgical history, family history, social history, and previous encounter notes.   Time spent on visit including pre-visit chart review and post-visit care and charting was 27 minutes  Yaqueline Gutter,PA-C

## 2023-06-23 ENCOUNTER — Encounter (INDEPENDENT_AMBULATORY_CARE_PROVIDER_SITE_OTHER): Payer: Self-pay | Admitting: Physician Assistant

## 2023-06-23 ENCOUNTER — Other Ambulatory Visit (HOSPITAL_COMMUNITY): Payer: Self-pay

## 2023-06-23 ENCOUNTER — Ambulatory Visit (INDEPENDENT_AMBULATORY_CARE_PROVIDER_SITE_OTHER): Payer: 59 | Admitting: Physician Assistant

## 2023-06-23 VITALS — BP 130/85 | HR 59 | Temp 98.2°F | Ht 69.0 in | Wt 282.0 lb

## 2023-06-23 DIAGNOSIS — G4733 Obstructive sleep apnea (adult) (pediatric): Secondary | ICD-10-CM | POA: Diagnosis not present

## 2023-06-23 DIAGNOSIS — E559 Vitamin D deficiency, unspecified: Secondary | ICD-10-CM

## 2023-06-23 DIAGNOSIS — E669 Obesity, unspecified: Secondary | ICD-10-CM

## 2023-06-23 DIAGNOSIS — Z6841 Body Mass Index (BMI) 40.0 and over, adult: Secondary | ICD-10-CM

## 2023-06-23 DIAGNOSIS — J302 Other seasonal allergic rhinitis: Secondary | ICD-10-CM

## 2023-06-23 DIAGNOSIS — R7303 Prediabetes: Secondary | ICD-10-CM

## 2023-06-23 MED ORDER — TIRZEPATIDE-WEIGHT MANAGEMENT 5 MG/0.5ML ~~LOC~~ SOAJ
5.0000 mg | SUBCUTANEOUS | 0 refills | Status: DC
Start: 2023-06-23 — End: 2023-07-15
  Filled 2023-06-23: qty 2, 28d supply, fill #0

## 2023-07-15 ENCOUNTER — Other Ambulatory Visit (HOSPITAL_COMMUNITY): Payer: Self-pay

## 2023-07-15 ENCOUNTER — Ambulatory Visit (INDEPENDENT_AMBULATORY_CARE_PROVIDER_SITE_OTHER): Admitting: Family Medicine

## 2023-07-15 ENCOUNTER — Encounter (INDEPENDENT_AMBULATORY_CARE_PROVIDER_SITE_OTHER): Payer: Self-pay | Admitting: Family Medicine

## 2023-07-15 VITALS — BP 120/84 | HR 59 | Temp 98.3°F | Ht 69.0 in | Wt 278.0 lb

## 2023-07-15 DIAGNOSIS — G4733 Obstructive sleep apnea (adult) (pediatric): Secondary | ICD-10-CM

## 2023-07-15 DIAGNOSIS — E669 Obesity, unspecified: Secondary | ICD-10-CM | POA: Diagnosis not present

## 2023-07-15 DIAGNOSIS — E7849 Other hyperlipidemia: Secondary | ICD-10-CM | POA: Diagnosis not present

## 2023-07-15 DIAGNOSIS — Z6841 Body Mass Index (BMI) 40.0 and over, adult: Secondary | ICD-10-CM | POA: Diagnosis not present

## 2023-07-15 MED ORDER — TIRZEPATIDE-WEIGHT MANAGEMENT 5 MG/0.5ML ~~LOC~~ SOAJ
5.0000 mg | SUBCUTANEOUS | 0 refills | Status: DC
Start: 2023-07-15 — End: 2023-08-20
  Filled 2023-07-15: qty 2, 28d supply, fill #0

## 2023-07-15 NOTE — Assessment & Plan Note (Signed)
 Doing well on Zepbound for assistance in managing OSA.  She needs a refill today.  Will continue current dose for next month and reassess at next appointment whether increase is necessary.

## 2023-07-15 NOTE — Progress Notes (Signed)
 SUBJECTIVE:  Chief Complaint: Obesity  Interim History: Patient went to [facility] and did get to do some sightseeing a few days after her work trip.  She's been conscious of food choices even though not necessarily able to dictate food options.  She has an upcoming trip to [facility] for 2 days and otherwise she will be home.  She is driving for that trip so she feels she can bring things with her.  Thinks structured plan will be easiest to follow.  She does get bored if she eats the same thing for long periods of time.  Lorna is here to discuss her progress with her obesity treatment plan. She is on the Category 3 Plan and states she is following her eating plan approximately 30 % of the time. She states she is not exercising.   OBJECTIVE: Visit Diagnoses: Problem List Items Addressed This Visit       Respiratory   OSA on CPAP - Primary   Doing well on Zepbound for assistance in managing OSA.  She needs a refill today.  Will continue current dose for next month and reassess at next appointment whether increase is necessary.      Relevant Medications   tirzepatide (ZEPBOUND) 5 MG/0.5ML Pen     Other   Other hyperlipidemia   LDL at goal from last lab.  Will get repeat labs with PCP in May.  Plan to discuss labs with patient after that time.      Other Visit Diagnoses       Obesity with starting BMI of 45.1       Relevant Medications   tirzepatide (ZEPBOUND) 5 MG/0.5ML Pen     BMI 40.0-44.9, adult (HCC) Current BMI41.7       Relevant Medications   tirzepatide (ZEPBOUND) 5 MG/0.5ML Pen       Vitals Temp: 98.3 F (36.8 C) BP: 120/84 Pulse Rate: (!) 59 SpO2: 100 %   Anthropometric Measurements Height: 5\' 9"  (1.753 m) Weight: 278 lb (126.1 kg) BMI (Calculated): 41.03 Weight at Last Visit: 282 lb Weight Lost Since Last Visit: 4 lb Weight Gained Since Last Visit: 0 Starting Weight: 305 lb Total Weight Loss (lbs): 278 lb (126.1 kg)   Body Composition  Body Fat %:  47.2 % Fat Mass (lbs): 131.4 lbs Muscle Mass (lbs): 139.6 lbs Total Body Water (lbs): 93.2 lbs Visceral Fat Rating : 14   Other Clinical Data Today's Visit #: 15 Starting Date: 08/26/22 Comments: Cat 3     ASSESSMENT AND PLAN:  Diet: Karman is currently in the action stage of change. As such, her goal is to continue with weight loss efforts and has agreed to the Category 3 Plan.   Exercise:  For substantial health benefits, adults should do at least 150 minutes (2 hours and 30 minutes) a week of moderate-intensity, or 75 minutes (1 hour and 15 minutes) a week of vigorous-intensity aerobic physical activity, or an equivalent combination of moderate- and vigorous-intensity aerobic activity. Aerobic activity should be performed in episodes of at least 10 minutes, and preferably, it should be spread throughout the week.  Will plan to discuss physical activity at next appointment.  Behavior Modification:  We discussed the following Behavioral Modification Strategies today: increasing lean protein intake, decreasing simple carbohydrates, meal planning and cooking strategies, and avoiding temptations.   Return in about 4 weeks (around 08/12/2023).Marland Kitchen She was informed of the importance of frequent follow up visits to maximize her success with intensive lifestyle modifications for her  multiple health conditions.  Attestation Statements:   Reviewed by clinician on day of visit: allergies, medications, problem list, medical history, surgical history, family history, social history, and previous encounter notes.   Reuben Likes, MD

## 2023-07-15 NOTE — Assessment & Plan Note (Signed)
 LDL at goal from last lab.  Will get repeat labs with PCP in May.  Plan to discuss labs with patient after that time.

## 2023-07-28 ENCOUNTER — Ambulatory Visit (INDEPENDENT_AMBULATORY_CARE_PROVIDER_SITE_OTHER): Admitting: Family Medicine

## 2023-08-05 NOTE — Progress Notes (Signed)
 Katherine Conway

## 2023-08-06 ENCOUNTER — Ambulatory Visit: Payer: 59 | Admitting: Neurology

## 2023-08-06 ENCOUNTER — Encounter: Payer: Self-pay | Admitting: Neurology

## 2023-08-06 VITALS — BP 126/88 | HR 80 | Ht 68.5 in | Wt 289.6 lb

## 2023-08-06 DIAGNOSIS — H4710 Unspecified papilledema: Secondary | ICD-10-CM

## 2023-08-06 DIAGNOSIS — G4733 Obstructive sleep apnea (adult) (pediatric): Secondary | ICD-10-CM | POA: Diagnosis not present

## 2023-08-06 DIAGNOSIS — Z8669 Personal history of other diseases of the nervous system and sense organs: Secondary | ICD-10-CM

## 2023-08-06 NOTE — Progress Notes (Signed)
 Provider:  Neomia Banner, MD  Primary Care Physician:  Cleave Curling, MD [address] [place] [address]     Referring Provider: Dr Donalynn Fry, OD.         Chief Complaint according to patient   Patient presents with:                HISTORY OF PRESENT ILLNESS:  Katherine Conway is a 53 y.o. 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  Since the beginning of OSA therapy there has been an improvement - but age related changes in visual acuity have been noted, reading glasses are needed. She has astigmatism.    Chief concern according to patient :  "I sleep well and I am here to see how my eye swelling is doing." Weight loss clinic  follows her.  I started on ZEBOUND after my OSA dx was established,  in February  2025. Her muscle mass has increased.  She follows a high protein diet.  She reports ongoing menopausal symptoms a night.    Katherine Conway is a 53 y.o. female patient who is here for revisit 08/06/2022 for CPAP -compliance.    08-06-2022: She is an allergy sufferer and takes Zyrtec at night and Singulair  in AM.   Chief concern according to patient : " I lost my sense taste a months ago, its slowly coming back. Everything tasted like cardboard.   I didn't feel any viral illness at the time , no change in medication. I tested negative for COVID 19. " Her papilledema was not related to ICP elevation after she underwent confirmatory CSF testing.  I would like to add that the patient neither had headaches when she presented with the very mild papilledema nor did she have significant headaches after the spinal tap.  Today I see 100% compliance for days and hours on her CPAP.  8 hours on average each night the setting is still between 6 and 16 cmH2O with 1 expiratory pressure relief centimeter water and the residual is an AHI of 0.7 excellent.  So apnea is fully controlled the  95th percentile pressure is 9.3 cm water, air leak is very low which speaks for a well-fitting mask at 0.6 L/min.  No central apneas arising and her Epworth Sleepiness Scale was endorsed at 5 points her fatigue severity score at 20 out of 63 points.  Clinically this has worked very well for her.   Her current medications list aside from Zyrtec and Singulair  is only of vitamin D  supplement about 1000 units/day.         Katherine Conway is a 52 y.o. female patient who is seen upon referral on 06/18/2022 from Dr Glossen at [facility] care  for a neurological consultation. The patient had not had any symptoms, the swelling of the left optic nerve was incidentally  discovered in a retinal scan and eye exam, scheduled routinely.    Chief concern according to patient :  No symptoms, no headaches, no blurred vision, no change in colour vision ( red ).     Review of Systems: Out of a complete 14 system review, the patient complains of only the following symptoms, and all other reviewed systems are negative.:   How likely are you to doze in the following situations: 0 = not likely, 1 = slight chance, 2 =  moderate chance, 3 = high chance  Sitting and Reading? Watching Television? Sitting inactive in a public place (theater or meeting)? Lying down in the afternoon when circumstances permit? Sitting and talking to someone? Sitting quietly after lunch without alcohol? In a car, while stopped for a few minutes in traffic? As a passenger in a car for an hour without a break?  Total = 4/ 24.  FSS: 23/ 63.    Social History   Socioeconomic History   Marital status: Divorced    Spouse name: Not on file   Number of children: Not on file   Years of education: Not on file   Highest education level: Master's degree (e.g., MA, MS, MEng, MEd, MSW, MBA)  Occupational History   Occupation: [employer]   Tobacco Use   Smoking status: Never   Smokeless tobacco: Never  Vaping Use   Vaping  status: Never Used  Substance and Sexual Activity   Alcohol use: Yes    Comment: occasional wine    Drug use: No   Sexual activity: Not Currently  Other Topics Concern   Not on file  Social History Narrative   Coffee sometimes    Right handed    Social Drivers of Health   Financial Resource Strain: Low Risk  (01/05/2023)   Overall Financial Resource Strain (CARDIA)    Difficulty of Paying Living Expenses: Not hard at all  Food Insecurity: No Food Insecurity (01/05/2023)   Hunger Vital Sign    Worried About Running Out of Food in the Last Year: Never true    Ran Out of Food in the Last Year: Never true  Transportation Needs: No Transportation Needs (01/05/2023)   PRAPARE - Administrator, Civil Service (Medical): No    Lack of Transportation (Non-Medical): No  Physical Activity: Sufficiently Active (01/05/2023)   Exercise Vital Sign    Days of Exercise per Week: 4 days    Minutes of Exercise per Session: 40 min  Stress: No Stress Concern Present (01/05/2023)   [employer] of Occupational Health - Occupational Stress Questionnaire    Feeling of Stress : Only a little  Social Connections: Moderately Integrated (01/05/2023)   Social Connection and Isolation Panel [NHANES]    Frequency of Communication with Friends and Family: More than three times a week    Frequency of Social Gatherings with Friends and Family: Once a week    Attends Religious Services: More than 4 times per year    Active Member of [employer] or Organizations: Yes    Attends Engineer, structural: More than 4 times per year    Marital Status: Divorced    Family History  Problem Relation Age of Onset   Hypertension Mother    Obesity Mother    Obesity Father    Hyperlipidemia Father    Hypertension Father    Hypertension Maternal Grandmother    Hypertension Maternal Grandfather    Hypertension Paternal Grandmother    Heart disease Paternal Grandfather    Hypertension Paternal Grandfather     Breast cancer Maternal Aunt     Past Medical History:  Diagnosis Date   Allergy    Edema of optic nerve    Osteoarthritis    Sleep apnea    Vitamin D  deficiency     Past Surgical History:  Procedure Laterality Date   CESAREAN SECTION       Current Outpatient Medications on File Prior to Visit  Medication Sig Dispense Refill   cetirizine (  ZYRTEC) 10 MG tablet      montelukast  (SINGULAIR ) 10 MG tablet Take 1 tablet (10 mg total) by mouth daily. 90 tablet 2   Nutritional Supplements (NUTRITIONAL SUPPLEMENT PO) Take by mouth. Liver Focus     NUTRITIONAL SUPPLEMENTS PO Take by mouth. Enzymes     tirzepatide  (ZEPBOUND ) 5 MG/0.5ML Pen Inject 5 mg into the skin once a week. 2 mL 0   Turmeric (QC TUMERIC COMPLEX PO) Take by mouth. Prana Tumeric and Ginger Extract     VITAMIN D  PO Take 1,000 Units by mouth daily.     Nutritional Supplements (NUTRITIONAL SUPPLEMENT PO) Take by mouth. Blood Sugar Focus     No current facility-administered medications on file prior to visit.    No Known Allergies   DIAGNOSTIC DATA (LABS, IMAGING, TESTING) - I reviewed patient records, labs, notes, testing and imaging myself where available.  Lab Results  Component Value Date   WBC 6.1 06/12/2022   HGB 13.2 06/12/2022   HCT 39.1 06/12/2022   MCV 92 06/12/2022   PLT 217 06/12/2022      Component Value Date/Time   NA 143 03/30/2023 1353   K 4.7 03/30/2023 1353   CL 104 03/30/2023 1353   CO2 24 03/30/2023 1353   GLUCOSE 78 03/30/2023 1353   BUN 26 (H) 03/30/2023 1353   CREATININE 0.92 03/30/2023 1353   CALCIUM 9.5 03/30/2023 1353   PROT 7.1 03/30/2023 1353   ALBUMIN 4.4 03/30/2023 1353   AST 20 03/30/2023 1353   ALT 17 03/30/2023 1353   ALKPHOS 75 03/30/2023 1353   BILITOT 0.7 03/30/2023 1353   GFRNONAA 73 02/07/2020 0000   GFRAA 85 02/07/2020 0000   Lab Results  Component Value Date   CHOL 165 03/30/2023   HDL 58 03/30/2023   LDLCALC 99 03/30/2023   TRIG 36 03/30/2023    CHOLHDL 2.9 06/12/2022   Lab Results  Component Value Date   HGBA1C 5.5 01/06/2023   Lab Results  Component Value Date   VITAMINB12 1,230 08/26/2022   Lab Results  Component Value Date   TSH 2.640 06/12/2022    PHYSICAL EXAM:  Today's Vitals   08/06/23 0829  BP: 126/88  Pulse: 80  Weight: 289 lb 9.6 oz (131.4 kg)  Height: 5' 8.5" (1.74 m)   Body mass index is 43.39 kg/m.   Wt Readings from Last 3 Encounters:  08/06/23 289 lb 9.6 oz (131.4 kg)  07/15/23 278 lb (126.1 kg)  06/23/23 282 lb (127.9 kg)     Ht Readings from Last 3 Encounters:  08/06/23 5' 8.5" (1.74 m)  07/15/23 5\' 9"  (1.753 m)  06/23/23 5\' 9"  (1.753 m)      General: The patient is awake, alert and appears not in acute distress. The patient is well groomed. Head: Normocephalic, atraumatic. Neck is supple. The patient is awake, alert and appears not in acute distress. The patient is well groomed.  Mallampati 2,  neck circumference:16.00 down from 16. 75 " !!!  inches . Nasal airflow patent 08-06-2022.  Retrognathia - there is an overbite noticed.  Dental status: intact  Cardiovascular:  Regular rate and cardiac rhythm by pulse,  without distended neck veins. Respiratory: Lungs are clear to auscultation.  Skin:  Without evidence of ankle edema, or rash. Trunk: The patient's posture is erect.   Neurologic exam : The patient is awake and alert, oriented to place and time.   Memory subjective described as intact.  Attention span & concentration ability appears  normal.  Speech is fluent,  without dysarthria, dysphonia or aphasia.  Mood and affect are appropriate.   Cranial nerves: no loss of smell or taste reported - fully vaccinated.  Pupils are equal and briskly reactive to light. Funduscopic exam  showed no bleeding, nicking or abnormal pulsation, no cotton wool lesions. But the left macular appears surrounded by a slightly paler retina.  Extraocular movements in vertical and horizontal planes were  intact and without nystagmus. No Diplopia. Visual fields by finger perimetry are intact. Hearing was intact to soft voice and finger rubbing.  Facial sensation intact to fine touch.  Facial motor strength is symmetric and tongue and uvula move midline.  Neck ROM : rotation, tilt and flexion extension were normal for age and shoulder shrug was symmetrical.    Toe and heel walk were deferred.  Deep tendon reflexes: in the  upper and lower extremities are symmetric and intact.      ASSESSMENT AND PLAN 53 y.o. year old female  here with:    1)  improved retinal swelling, no active process seen. Awaiting OPTOMETRY results in June.  Symptom free .   2)  LP in 2024 :  No ICH evidence, the opening pressure was 16 cm water, the closing pressure 10 cm water after removal of only 6 ml CSF.  No medication were needed ( Acetazolamide/ Topiramate ) .   3) Brain MRI normal. Dr Godwin Lat in 2024.  4) OSA on CPAP very well controlled.  On Zepbound  - will see if weight loss  makes CPAP unnecessary in the future.    I plan to follow up  through our NP within 12 months.   I would like to thank Cleave Curling, MD and Cleave Curling, Md [address] [place],  [place] [address] for allowing me to meet with and to take care of this pleasant patient.   CC: I will share my notes with [facility] .  After spending a total time of  25  minutes face to face and additional time for physical and neurologic examination, review of laboratory studies,  personal review of imaging studies, reports and results of other testing and review of referral information / records as far as provided in visit,   Electronically signed by: Neomia Banner, MD 08/06/2023 8:47 AM  [facility] Board certified by The [employer] of Sleep Medicine and Diplomate of the [employer] of Sleep Medicine. Board certified In Neurology through the ABPN, Fellow of the [employer] of  Neurology.

## 2023-08-06 NOTE — Patient Instructions (Signed)
 53 y.o. year old female  here with:     1)  improved retinal swelling, no active process seen. Awaiting OPTOMETRY results in June.  Symptom free .    2)  LP in 2024 :  No ICH evidence, the opening pressure was 16 cm water, the closing pressure 10 cm water after removal of only 6 ml CSF.  No medication were needed ( Acetazolamide/ Topiramate ) .    3) Brain MRI normal. Interpreted by Dr Godwin Lat in 2024.   4) OSA on CPAP very well controlled.  On Zepbound  - will see if weight loss  makes CPAP unnecessary in the future.      I plan to follow up  through our NP within 12 months.    I would like to thank Cleave Curling, MD and Cleave Curling, Md [address] [place],  [place] [address] for allowing me to meet with and to take care of this pleasant patient.    CC: I will share my notes with [facility] .   After spending a total time of  25  minutes face to face and additional time for physical and neurologic examination, review of laboratory studies,  personal review of imaging studies, reports and results of other testing and review of referral information / records as far as provided in visit,    Electronically signed by: Neomia Banner, MD 08/06/2023 8:47 AM

## 2023-08-11 ENCOUNTER — Ambulatory Visit (INDEPENDENT_AMBULATORY_CARE_PROVIDER_SITE_OTHER): Payer: 59 | Admitting: Internal Medicine

## 2023-08-11 VITALS — BP 120/82 | HR 85 | Temp 98.1°F | Ht 68.0 in | Wt 290.0 lb

## 2023-08-11 DIAGNOSIS — Z8249 Family history of ischemic heart disease and other diseases of the circulatory system: Secondary | ICD-10-CM | POA: Insufficient documentation

## 2023-08-11 DIAGNOSIS — E66813 Obesity, class 3: Secondary | ICD-10-CM

## 2023-08-11 DIAGNOSIS — G4733 Obstructive sleep apnea (adult) (pediatric): Secondary | ICD-10-CM

## 2023-08-11 DIAGNOSIS — Z0000 Encounter for general adult medical examination without abnormal findings: Secondary | ICD-10-CM | POA: Insufficient documentation

## 2023-08-11 DIAGNOSIS — Z6841 Body Mass Index (BMI) 40.0 and over, adult: Secondary | ICD-10-CM

## 2023-08-11 NOTE — Assessment & Plan Note (Signed)
 We discussed the use of tests listed below to further evaluate her cardiac risk. She is agreeable to further testing. She is aware cardiac CT is $99.

## 2023-08-11 NOTE — Assessment & Plan Note (Signed)

## 2023-08-11 NOTE — Assessment & Plan Note (Signed)
 Previous labs reviewed, her A1c has been elevated in the past. I will check an A1c today. Reminded to avoid refined sugars including sugary drinks/foods and processed meats including bacon, sausages and deli meats.

## 2023-08-11 NOTE — Progress Notes (Signed)
 I,Victoria T [name] Lim, CMA,acting as a Neurosurgeon for Smiley Dung, MD.,have documented all relevant documentation on the behalf of Smiley Dung, MD,as directed by  Smiley Dung, MD while in the presence of Smiley Dung, MD.  Subjective:    Patient ID: Katherine Conway , female    DOB: 1970/09/22 , 53 y.o.   MRN: [id number]  Chief Complaint  Patient presents with   Annual Exam    Patient presents today for annual exam. GYN: DR Rivard. She has no specific questions or concerns. Letter sent to gyn for pap/mammogram.      HPI Discussed the use of AI scribe software for clinical note transcription with the patient, who gave verbal consent to proceed.  History of Present Illness Katherine Conway is a 53 year old female who presents for an annual physical exam.  She exercises regularly, about three to four days a week for approximately 45 minutes each session. Despite this, she has experienced a recent weight gain of about seven pounds, which she attributes to differences in scales and recent life stressors, including a death in the family, travel, and work-related stress. She feels she has plateaued in her weight loss journey.  She is currently on Zepbound , which she started two months ago, initially at the lowest dose, and recently increased to 5mg  dose in April. Her healthcare provider is monitoring her protein intake to ensure she receives adequate nutrition. She has regular bowel movements while on Zepbound .  Her family history is significant for heart disease and a maternal aunt who had breast cancer. She mentions having had a mammogram recently and recalls her last Pap smear was in 2021, believing she is due for another one this year.    Past Medical History:  Diagnosis Date   Allergy    Edema of optic nerve    Osteoarthritis    Sleep apnea    Vitamin D  deficiency      Family History  Problem Relation Age of Onset   Hypertension Mother    Obesity Mother    Obesity  Father    Hyperlipidemia Father    Hypertension Father    Hypertension Maternal Grandmother    Hypertension Maternal Grandfather    Hypertension Paternal Grandmother    Heart disease Paternal Grandfather    Hypertension Paternal Grandfather    Breast cancer Maternal Aunt      Current Outpatient Medications:    cetirizine (ZYRTEC) 10 MG tablet, , Disp: , Rfl:    montelukast  (SINGULAIR ) 10 MG tablet, Take 1 tablet (10 mg total) by mouth daily., Disp: 90 tablet, Rfl: 2   Nutritional Supplements (NUTRITIONAL SUPPLEMENT PO), Take by mouth. Liver Focus, Disp: , Rfl:    tirzepatide  (ZEPBOUND ) 5 MG/0.5ML Pen, Inject 5 mg into the skin once a week., Disp: 2 mL, Rfl: 0   Turmeric (QC TUMERIC COMPLEX PO), Take by mouth. Prana Tumeric and Ginger Extract, Disp: , Rfl:    VITAMIN D  PO, Take 1,000 Units by mouth daily., Disp: , Rfl:    NUTRITIONAL SUPPLEMENTS PO, Take by mouth. Enzymes (Patient not taking: Reported on 08/11/2023), Disp: , Rfl:    No Known Allergies    The patient states she uses none for birth control. Patient's last menstrual period was 03/31/2013 (approximate).. Negative for Dysmenorrhea. Negative for: breast discharge, breast lump(s), breast pain and breast self exam. Associated symptoms include abnormal vaginal bleeding. Pertinent negatives include abnormal bleeding (hematology), anxiety, decreased libido, depression, difficulty falling sleep, dyspareunia, history of  infertility, nocturia, sexual dysfunction, sleep disturbances, urinary incontinence, urinary urgency, vaginal discharge and vaginal itching. Diet regular.The patient states her exercise level is  moderate.  . The patient's tobacco use is:  Social History   Tobacco Use  Smoking Status Never  Smokeless Tobacco Never  . She has been exposed to passive smoke. The patient's alcohol use is:  Social History   Substance and Sexual Activity  Alcohol Use Yes   Comment: occasional wine     Review of Systems   Constitutional: Negative.   HENT: Negative.    Eyes: Negative.   Respiratory: Negative.    Cardiovascular: Negative.   Gastrointestinal: Negative.   Endocrine: Negative.   Genitourinary: Negative.   Musculoskeletal: Negative.   Skin: Negative.   Allergic/Immunologic: Negative.   Neurological: Negative.   Hematological: Negative.   Psychiatric/Behavioral: Negative.       Today's Vitals   08/11/23 1406  BP: 120/82  Pulse: 85  Temp: 98.1 F (36.7 C)  SpO2: 98%  Weight: 290 lb (131.5 kg)  Height: 5\' 8"  (1.727 m)   Body mass index is 44.09 kg/m.  Wt Readings from Last 3 Encounters:  08/11/23 290 lb (131.5 kg)  08/06/23 289 lb 9.6 oz (131.4 kg)  07/15/23 278 lb (126.1 kg)     Objective:  Physical Exam Vitals and nursing note reviewed.  Constitutional:      Appearance: Normal appearance. She is obese.  HENT:     Head: Normocephalic and atraumatic.     Right Ear: Tympanic membrane, ear canal and external ear normal.     Left Ear: Tympanic membrane, ear canal and external ear normal.     Mouth/Throat:     Pharynx: No oropharyngeal exudate or posterior oropharyngeal erythema.  Eyes:     Extraocular Movements: Extraocular movements intact.     Conjunctiva/sclera: Conjunctivae normal.     Pupils: Pupils are equal, round, and reactive to light.  Cardiovascular:     Rate and Rhythm: Normal rate and regular rhythm.     Pulses: Normal pulses.     Heart sounds: Normal heart sounds.  Pulmonary:     Effort: Pulmonary effort is normal.     Breath sounds: Normal breath sounds.  Chest:  Breasts:    Tanner Score is 5.     Right: Normal.     Left: Normal.  Abdominal:     General: Bowel sounds are normal.     Palpations: Abdomen is soft.     Comments: Obese, soft, difficult to assess organomegaly  Genitourinary:    Comments: deferred Musculoskeletal:        General: Normal range of motion.     Cervical back: Normal range of motion and neck supple.  Skin:    General:  Skin is warm and dry.  Neurological:     General: No focal deficit present.     Mental Status: She is alert and oriented to person, place, and time.  Psychiatric:        Mood and Affect: Mood normal.        Behavior: Behavior normal.       Assessment And Plan:     Routine general medical examination at health care facility Assessment & Plan: A full exam was performed.  Importance of monthly self breast exams was discussed with the patient.  She is advised to get 30-45 minutes of regular exercise, no less than four to five days per week. Both weight-bearing and aerobic exercises are recommended.  She is  advised to follow a healthy diet with at least six fruits/veggies per day, decrease intake of red meat and other saturated fats and to increase fish intake to twice weekly.  Meats/fish should not be fried -- baked, boiled or broiled is preferable. It is also important to cut back on your sugar intake.  Be sure to read labels - try to avoid anything with added sugar, high fructose corn syrup or other sweeteners.  If you must use a sweetener, you can try stevia or monkfruit.  It is also important to avoid artificially sweetened foods/beverages and diet drinks. Lastly, wear SPF 50 sunscreen on exposed skin and when in direct sunlight for an extended period of time.  Be sure to avoid fast food restaurants and aim for at least 60 ounces of water daily.      Orders: -     CBC -     CMP14+EGFR -     Hemoglobin A1c -     TSH  OSA on CPAP Assessment & Plan: Chronic, currently on Zepbound  as per [facility]. She has tolerated 5mg  dose thus far without any issues. Encouraged to wear CPAP at least four hours per night.    Class 3 severe obesity due to excess calories without serious comorbidity with body mass index (BMI) of 40.0 to 44.9 in adult Assessment & Plan: BMI 44, currently on GLP-1 agonist therapy. Encouraged to optimize her protein intake and to continue with regular exercise. She has  incorporated strength training into her regular exercise routine.    Family history of heart disease Assessment & Plan: We discussed the use of tests listed below to further evaluate her cardiac risk. She is agreeable to further testing. She is aware cardiac CT is $99.   Orders: -     Lipoprotein A (LPA) -     CT CARDIAC SCORING (SELF PAY ONLY); Future     Return in 6 months (on 02/11/2024), or chol ?, for 1 year HM, . Patient was given opportunity to ask questions. Patient verbalized understanding of the plan and was able to repeat key elements of the plan. All questions were answered to their satisfaction.   I, Smiley Dung, MD, have reviewed all documentation for this visit. The documentation on 08/11/23 for the exam, diagnosis, procedures, and orders are all accurate and complete.

## 2023-08-11 NOTE — Patient Instructions (Signed)

## 2023-08-11 NOTE — Assessment & Plan Note (Signed)
 BMI 44, currently on GLP-1 agonist therapy. Encouraged to optimize her protein intake and to continue with regular exercise. She has incorporated strength training into her regular exercise routine.

## 2023-08-11 NOTE — Assessment & Plan Note (Signed)
 Chronic, currently on Zepbound  as per [facility]. She has tolerated 5mg  dose thus far without any issues. Encouraged to wear CPAP at least four hours per night.

## 2023-08-13 LAB — CMP14+EGFR
ALT: 14 IU/L (ref 0–32)
AST: 16 IU/L (ref 0–40)
Albumin: 4.3 g/dL (ref 3.8–4.9)
Alkaline Phosphatase: 75 IU/L (ref 44–121)
BUN/Creatinine Ratio: 28 — ABNORMAL HIGH (ref 9–23)
BUN: 27 mg/dL — ABNORMAL HIGH (ref 6–24)
Bilirubin Total: 0.5 mg/dL (ref 0.0–1.2)
CO2: 21 mmol/L (ref 20–29)
Calcium: 9.7 mg/dL (ref 8.7–10.2)
Chloride: 102 mmol/L (ref 96–106)
Creatinine, Ser: 0.97 mg/dL (ref 0.57–1.00)
Globulin, Total: 2.5 g/dL (ref 1.5–4.5)
Glucose: 77 mg/dL (ref 70–99)
Potassium: 4.6 mmol/L (ref 3.5–5.2)
Sodium: 139 mmol/L (ref 134–144)
Total Protein: 6.8 g/dL (ref 6.0–8.5)
eGFR: 70 mL/min/{1.73_m2} (ref >59)

## 2023-08-13 LAB — CBC
Hematocrit: 39.9 % (ref 34.0–46.6)
Hemoglobin: 13.5 g/dL (ref 11.1–15.9)
MCH: 31.8 pg (ref 26.6–33.0)
MCHC: 33.8 g/dL (ref 31.5–35.7)
MCV: 94 fL (ref 79–97)
Platelets: 224 10*3/uL (ref 150–450)
RBC: 4.25 x10E6/uL (ref 3.77–5.28)
RDW: 13.1 % (ref 11.7–15.4)
WBC: 7.9 10*3/uL (ref 3.4–10.8)

## 2023-08-13 LAB — TSH: TSH: 1.63 u[IU]/mL (ref 0.450–4.500)

## 2023-08-13 LAB — HEMOGLOBIN A1C
Est. average glucose Bld gHb Est-mCnc: 105 mg/dL
Hgb A1c MFr Bld: 5.3 % (ref 4.8–5.6)

## 2023-08-13 LAB — LIPOPROTEIN A (LPA): Lipoprotein (a): 12.9 nmol/L (ref <75.0)

## 2023-08-14 ENCOUNTER — Encounter: Payer: Self-pay | Admitting: Internal Medicine

## 2023-08-20 ENCOUNTER — Other Ambulatory Visit (HOSPITAL_COMMUNITY): Payer: Self-pay

## 2023-08-20 ENCOUNTER — Ambulatory Visit (INDEPENDENT_AMBULATORY_CARE_PROVIDER_SITE_OTHER): Admitting: Family Medicine

## 2023-08-20 ENCOUNTER — Encounter (INDEPENDENT_AMBULATORY_CARE_PROVIDER_SITE_OTHER): Payer: Self-pay | Admitting: Family Medicine

## 2023-08-20 DIAGNOSIS — G4733 Obstructive sleep apnea (adult) (pediatric): Secondary | ICD-10-CM

## 2023-08-20 DIAGNOSIS — Z6841 Body Mass Index (BMI) 40.0 and over, adult: Secondary | ICD-10-CM

## 2023-08-20 DIAGNOSIS — E66813 Obesity, class 3: Secondary | ICD-10-CM

## 2023-08-20 MED ORDER — ZEPBOUND 7.5 MG/0.5ML ~~LOC~~ SOAJ
7.5000 mg | SUBCUTANEOUS | 0 refills | Status: DC
  Filled 2023-08-20: qty 2, 28d supply, fill #0

## 2023-08-20 MED ORDER — ZEPBOUND 7.5 MG/0.5ML ~~LOC~~ SOAJ
7.5000 mg | SUBCUTANEOUS | 0 refills | Status: DC
Start: 2023-08-20 — End: 2023-09-24

## 2023-08-20 NOTE — Progress Notes (Signed)
 SUBJECTIVE:  Chief Complaint: Obesity  Interim History: Patient had a nice Mother's Day- both of her kids were in town and they went to [employer] and got to hang out.  Last couple of weeks she isn't feeling satisfied when eating.  She is craving carbs and salty food.  She isn't feeling nauseous or constipated.  For Memorial Day she is planning on being home and has a free weekend.  Over the next few week she is wondering about substituting meal plan with different options in the same macro and calorie budget.   Katherine Conway is here to discuss her progress with her obesity treatment plan. She is on the Category 3 Plan and states she is following her eating plan approximately 90 % of the time. She states she is exercising 45 minutes 4 times per week. She is currently doing strength and cardio exercises.   OBJECTIVE: Visit Diagnoses: Problem List Items Addressed This Visit       Respiratory   OSA on CPAP   Recently saw her sleep physician and is doing very well in terms of compliance and improved quality with CPAP.  Continue current treatment plan.  Follow up Dr. Albertina Hugger at next scheduled appt.        Other   Class 3 severe obesity due to excess calories without serious comorbidity with body mass index (BMI) of 40.0 to 44.9 in adult   No side effects on Zepbound  5mg .  She is not feeing satisfied with her food intake anymore.  Will increase Zepbound  to 7.5mg  weekly.  Follow up at next appointment in 1 month.      Relevant Medications   tirzepatide  (ZEPBOUND ) 7.5 MG/0.5ML Pen   Other Visit Diagnoses       Morbid obesity (HCC)    -  Primary   Relevant Medications   tirzepatide  (ZEPBOUND ) 7.5 MG/0.5ML Pen       Vitals Temp: 98.7 F (37.1 C) BP: 112/76 Pulse Rate: (!) 54 SpO2: 90 %   Anthropometric Measurements Height: 5\' 9"  (1.753 m) Weight: 280 lb (127 kg) BMI (Calculated): 41.33 Weight at Last Visit: 278 lb Weight Lost Since Last Visit: 0 Weight Gained Since Last  Visit: 2 lb Starting Weight: 305 lb Total Weight Loss (lbs): 276 lb (125.2 kg)   Body Composition  Body Fat %: 47.9 % Fat Mass (lbs): 134.2 lbs Muscle Mass (lbs): 138.4 lbs Total Body Water (lbs): 95.8 lbs Visceral Fat Rating : 15   Other Clinical Data Fasting: Yes Labs: No Today's Visit #: 16 Starting Date: 08/26/22     ASSESSMENT AND PLAN:  Diet: Annalisha is currently in the action stage of change. As such, her goal is to continue with weight loss efforts and has agreed to keeping a food journal and adhering to recommended goals of 1300-1400 calories and 90 or more grams protein daily.  Patient to start food log or journaling meal plan.  The initial goal will be to habitually log or journal for at least 4 days a week.  The expectation it that patient may not initially meet calorie or protein goals as the nturitional understanding of food intake is begun.  We discussed the 10:1 ratio when reading a food label.  Patient agrees to keep a food log either electronically or on paper and bring to the next appointment to be able to dissect and discuss it with provider.     Exercise:  For substantial health benefits, adults should do at least 150 minutes (2 hours  and 30 minutes) a week of moderate-intensity, or 75 minutes (1 hour and 15 minutes) a week of vigorous-intensity aerobic physical activity, or an equivalent combination of moderate- and vigorous-intensity aerobic activity. Aerobic activity should be performed in episodes of at least 10 minutes, and preferably, it should be spread throughout the week.  Behavior Modification:  We discussed the following Behavioral Modification Strategies today: increasing lean protein intake, decreasing simple carbohydrates, increasing vegetables, meal planning and cooking strategies, and keeping healthy foods in the home.   Return in about 4 weeks (around 09/17/2023).   She was informed of the importance of frequent follow up visits to maximize her  success with intensive lifestyle modifications for her multiple health conditions.  Attestation Statements:   Reviewed by clinician on day of visit: allergies, medications, problem list, medical history, surgical history, family history, social history, and previous encounter notes.   Donaciano Frizzle, MD

## 2023-08-20 NOTE — Assessment & Plan Note (Signed)
 No side effects on Zepbound  5mg .  She is not feeing satisfied with her food intake anymore.  Will increase Zepbound  to 7.5mg  weekly.  Follow up at next appointment in 1 month.

## 2023-08-20 NOTE — Assessment & Plan Note (Signed)
 Recently saw her sleep physician and is doing very well in terms of compliance and improved quality with CPAP.  Continue current treatment plan.  Follow up Dr. Albertina Hugger at next scheduled appt.

## 2023-09-22 ENCOUNTER — Ambulatory Visit: Payer: Self-pay | Admitting: Internal Medicine

## 2023-09-22 ENCOUNTER — Other Ambulatory Visit (HOSPITAL_COMMUNITY): Payer: Self-pay

## 2023-09-22 ENCOUNTER — Ambulatory Visit (HOSPITAL_BASED_OUTPATIENT_CLINIC_OR_DEPARTMENT_OTHER)
Admission: RE | Admit: 2023-09-22 | Discharge: 2023-09-22 | Disposition: A | Discharge status: Home / Self Care | Payer: Self-pay | Source: Ambulatory Visit | Attending: Internal Medicine | Admitting: Internal Medicine

## 2023-09-22 ENCOUNTER — Other Ambulatory Visit (INDEPENDENT_AMBULATORY_CARE_PROVIDER_SITE_OTHER): Payer: Self-pay | Admitting: Family Medicine

## 2023-09-22 DIAGNOSIS — Z8249 Family history of ischemic heart disease and other diseases of the circulatory system: Secondary | ICD-10-CM | POA: Insufficient documentation

## 2023-09-23 ENCOUNTER — Other Ambulatory Visit (HOSPITAL_COMMUNITY): Payer: Self-pay

## 2023-09-23 ENCOUNTER — Other Ambulatory Visit (INDEPENDENT_AMBULATORY_CARE_PROVIDER_SITE_OTHER): Payer: Self-pay | Admitting: Family Medicine

## 2023-09-24 ENCOUNTER — Encounter (INDEPENDENT_AMBULATORY_CARE_PROVIDER_SITE_OTHER): Payer: Self-pay | Admitting: Family Medicine

## 2023-09-24 ENCOUNTER — Other Ambulatory Visit (HOSPITAL_COMMUNITY): Payer: Self-pay

## 2023-09-24 ENCOUNTER — Encounter (HOSPITAL_COMMUNITY): Payer: Self-pay

## 2023-09-24 NOTE — Telephone Encounter (Signed)
 LAST APPOINTMENT DATE: 08/20/2023 NEXT APPOINTMENT DATE: 10/05/2023   [id number] - [place], [place] - [address] [place] [address] Phone: [phone] Fax: [phone]  [place] [facility] [address] [place] [address] Phone: [phone] Fax: [phone]  Patient is requesting a refill of the following medications: Requested Prescriptions   Pending Prescriptions Disp Refills   tirzepatide  (ZEPBOUND ) 7.5 MG/0.5ML Pen 2 mL 0    Sig: Inject 7.5 mg into the skin once a week.    Date last filled: 08/20/2023 Previously prescribed by Dr Pink Bridges  Lab Results  Component Value Date   HGBA1C 5.3 08/11/2023   HGBA1C 5.5 01/06/2023   HGBA1C 5.7 (H) 08/26/2022   Lab Results  Component Value Date   LDLCALC 99 03/30/2023   CREATININE 0.97 08/11/2023   Lab Results  Component Value Date   VD25OH 77.1 03/30/2023   VD25OH 40.8 08/26/2022   VD25OH 7.7 09/05/2015    BP Readings from Last 3 Encounters:  08/20/23 112/76  08/11/23 120/82  08/06/23 126/88

## 2023-10-05 ENCOUNTER — Other Ambulatory Visit (HOSPITAL_COMMUNITY): Payer: Self-pay

## 2023-10-05 ENCOUNTER — Encounter (INDEPENDENT_AMBULATORY_CARE_PROVIDER_SITE_OTHER): Payer: Self-pay | Admitting: Family Medicine

## 2023-10-05 ENCOUNTER — Ambulatory Visit (INDEPENDENT_AMBULATORY_CARE_PROVIDER_SITE_OTHER): Admitting: Family Medicine

## 2023-10-05 VITALS — BP 116/81 | HR 55 | Temp 98.4°F | Ht 69.0 in | Wt 280.0 lb

## 2023-10-05 DIAGNOSIS — Z6841 Body Mass Index (BMI) 40.0 and over, adult: Secondary | ICD-10-CM | POA: Diagnosis not present

## 2023-10-05 DIAGNOSIS — G4733 Obstructive sleep apnea (adult) (pediatric): Secondary | ICD-10-CM | POA: Diagnosis not present

## 2023-10-05 MED ORDER — ZEPBOUND 7.5 MG/0.5ML ~~LOC~~ SOAJ
7.5000 mg | SUBCUTANEOUS | 0 refills | Status: DC
Start: 2023-10-05 — End: 2023-11-04
  Filled 2023-10-05: qty 2, 28d supply, fill #0

## 2023-10-05 NOTE — Progress Notes (Signed)
 SUBJECTIVE:  Chief Complaint: Obesity  Interim History: since last appointment patient has been traveling quite a bit- she has had 3 week long trips.  She hasn't been exercising as much either.  There has been more layoffs at work and she has more responsibility than she did previously. Foodwise she has been doing well with breakfast and lunch and staying on plan.  Dinner tends to be off plan and is more due to convenience.  She is home for the next 3 weeks then has a trip on the 4th week. She wants to get more consistent with former meal plan.  Xan is here to discuss her progress with her obesity treatment plan. She is on the Category 3 Plan and states she is following her eating plan approximately 30 % of the time. She states she is exercising 45 minutes 2 times per week.   OBJECTIVE: Visit Diagnoses: Problem List Items Addressed This Visit       Respiratory   OSA on CPAP - Primary   Relevant Medications   tirzepatide  (ZEPBOUND ) 7.5 MG/0.5ML Pen   Other Visit Diagnoses       Morbid obesity (HCC)       Relevant Medications   tirzepatide  (ZEPBOUND ) 7.5 MG/0.5ML Pen     BMI 40.0-44.9, adult (HCC) Current BMI41.7       Relevant Medications   tirzepatide  (ZEPBOUND ) 7.5 MG/0.5ML Pen       No data recorded     10/05/2023    8:00 AM 08/20/2023   10:00 AM 08/11/2023    2:06 PM  Vitals with BMI  Height 5' 9 5' 9 5' 8  Weight 280 lbs 280 lbs 290 lbs  BMI 41.33 41.33 44.1  Systolic 116 112 879  Diastolic 81 76 82  Pulse 55 54 85        ASSESSMENT AND PLAN: Assessment & Plan OSA on CPAP Patient continues to use her CPAP consistently with improvement in sleep quantity and quality.  She denies air leaks or system malfunctions.  Continued compliance encouraged.  She is additionally using zepbound  to assist in management.  She needs a refill today. Morbid obesity (HCC) Anthropometric Measurements Height: 5' 9 (1.753 m) Weight: 280 lb (127 kg) BMI (Calculated):  41.33 Weight at Last Visit: 280lb Weight Lost Since Last Visit: 0 Weight Gained Since Last Visit: 0 Starting Weight: 305lb Total Weight Loss (lbs): 25 lb (11.3 kg) Body Composition  Body Fat %: 47.1 % Fat Mass (lbs): 132 lbs Muscle Mass (lbs): 140.8 lbs Total Body Water (lbs): 99.6 lbs Visceral Fat Rating : 15 Other Clinical Data Fasting: yes Labs: no Today's Visit #: 17 Starting Date: 08/26/22  BMI 40.0-44.9, adult (HCC) Current BMI41.7    Diet: Nan is currently in the action stage of change. As such, her goal is to continue with weight loss efforts and has agreed to the Category 3 Plan and keeping a food journal and adhering to recommended goals of 1400-1500 calories and 100 or more grams of protein daily.   Exercise:  All adults should avoid inactivity. Some activity is better than none, and adults who participate in any amount of physical activity, gain some health benefits.  Behavior Modification:  We discussed the following Behavioral Modification Strategies today: increasing lean protein intake, decreasing simple carbohydrates, increasing vegetables, and meal planning and cooking strategies.   Return in about 4 weeks (around 11/02/2023).   She was informed of the importance of frequent follow up visits to maximize her success  with intensive lifestyle modifications for her multiple health conditions.  Attestation Statements:   Reviewed by clinician on day of visit: allergies, medications, problem list, medical history, surgical history, family history, social history, and previous encounter notes.     [name], MD

## 2023-10-06 LAB — HM DIABETES EYE EXAM

## 2023-10-11 NOTE — Assessment & Plan Note (Signed)
 Patient continues to use her CPAP consistently with improvement in sleep quantity and quality.  She denies air leaks or system malfunctions.  Continued compliance encouraged.  She is additionally using zepbound  to assist in management.  She needs a refill today.

## 2023-11-04 ENCOUNTER — Ambulatory Visit (INDEPENDENT_AMBULATORY_CARE_PROVIDER_SITE_OTHER): Admitting: Family Medicine

## 2023-11-04 ENCOUNTER — Other Ambulatory Visit (HOSPITAL_COMMUNITY): Payer: Self-pay

## 2023-11-04 ENCOUNTER — Encounter (INDEPENDENT_AMBULATORY_CARE_PROVIDER_SITE_OTHER): Payer: Self-pay | Admitting: Family Medicine

## 2023-11-04 VITALS — BP 135/86 | HR 56 | Temp 98.6°F | Ht 69.0 in | Wt 285.0 lb

## 2023-11-04 DIAGNOSIS — G4733 Obstructive sleep apnea (adult) (pediatric): Secondary | ICD-10-CM

## 2023-11-04 DIAGNOSIS — Z6841 Body Mass Index (BMI) 40.0 and over, adult: Secondary | ICD-10-CM

## 2023-11-04 MED ORDER — ZEPBOUND 7.5 MG/0.5ML ~~LOC~~ SOAJ
7.5000 mg | SUBCUTANEOUS | 0 refills | Status: DC
Start: 2023-11-04 — End: 2023-12-02
  Filled 2023-11-04: qty 2, 28d supply, fill #0

## 2023-11-04 NOTE — Progress Notes (Unsigned)
 SUBJECTIVE:  Chief Complaint: Obesity  Interim History: Over the last month patient was home a good portion of the month.  She was in [place] last week and felt prior to that she was doing very well.  When she travels she feels like she swells.  She is leaving for a cruise this weekend- [place], [place] and [place].  She is stressed currently prepping for her trip and getting her house appraised. She has been consistent on Category 3.  She can do a strong week on plan then has a struggle week.  Dinner tends to be the struggle because she is tired sometimes and will order out.  Food noise better on 7.5mg  dose of zepbound .  Katherine Conway is here to discuss her progress with her obesity treatment plan. She is on the Category 3 Plan and states she is following her eating plan approximately 70 % of the time. She states she is exercising 45 minutes 2 times per week.   OBJECTIVE: Visit Diagnoses: Problem List Items Addressed This Visit       Respiratory   OSA on CPAP   Relevant Medications   tirzepatide  (ZEPBOUND ) 7.5 MG/0.5ML Pen   Other Visit Diagnoses       Morbid obesity (HCC)       Relevant Medications   tirzepatide  (ZEPBOUND ) 7.5 MG/0.5ML Pen       Vitals Temp: 98.6 F (37 C) BP: 135/86 Pulse Rate: (!) 56 SpO2: 98 %   Anthropometric Measurements Height: 5' 9 (1.753 m) Weight: 285 lb (129.3 kg) BMI (Calculated): 42.07 Weight at Last Visit: 280lb Weight Lost Since Last Visit: 0 Weight Gained Since Last Visit: 5lb Starting Weight: 305lb Total Weight Loss (lbs): 20 lb (9.072 kg)   Body Composition  Body Fat %: 51 % Fat Mass (lbs): 145.8 lbs Muscle Mass (lbs): 133 lbs Total Body Water (lbs): 107.4 lbs Visceral Fat Rating : 16   Other Clinical Data Fasting: yes Labs: no Today's Visit #: 18 Starting Date: 08/26/22 Comments: Cat 3     ASSESSMENT AND PLAN: Assessment & Plan OSA on CPAP Patient is tolerating CPAP and Zepbound  therapy.  She reports that with all  of her travel she has been still capable of staying consistent with her treatment.  Needs a refill of stepdown today.  No change in medication dosage at this time.  Will reevaluate response after patient returns from her upcoming trip. Morbid obesity (HCC)  BMI 40.0-44.9, adult (HCC) Current BMI41.7    Diet: Aurea is currently in the action stage of change. As such, her goal is to continue with weight loss efforts and has agreed to the Category 3 Plan and keeping a food journal and adhering to recommended goals of 1400-1500 calories and 100 or more grams of protein.   Exercise:  For substantial health benefits, adults should do at least 150 minutes (2 hours and 30 minutes) a week of moderate-intensity, or 75 minutes (1 hour and 15 minutes) a week of vigorous-intensity aerobic physical activity, or an equivalent combination of moderate- and vigorous-intensity aerobic activity. Aerobic activity should be performed in episodes of at least 10 minutes, and preferably, it should be spread throughout the week.  Behavior Modification:  We discussed the following Behavioral Modification Strategies today: increasing lean protein intake, decreasing simple carbohydrates, increasing vegetables, meal planning and cooking strategies, and planning for success. We discussed various medication options to help Ming with her weight loss efforts and we both agreed to continue zepbound  at current dose.  Return  in about 4 weeks (around 12/02/2023).   She was informed of the importance of frequent follow up visits to maximize her success with intensive lifestyle modifications for her multiple health conditions.  Attestation Statements:   Reviewed by clinician on day of visit: allergies, medications, problem list, medical history, surgical history, family history, social history, and previous encounter notes.     [name], MD

## 2023-11-05 NOTE — Assessment & Plan Note (Signed)
 Patient is tolerating CPAP and Zepbound  therapy.  She reports that with all of her travel she has been still capable of staying consistent with her treatment.  Needs a refill of stepdown today.  No change in medication dosage at this time.  Will reevaluate response after patient returns from her upcoming trip.

## 2023-11-29 ENCOUNTER — Ambulatory Visit: Payer: Self-pay | Admitting: Neurology

## 2023-12-02 ENCOUNTER — Ambulatory Visit (INDEPENDENT_AMBULATORY_CARE_PROVIDER_SITE_OTHER): Admitting: Family Medicine

## 2023-12-02 ENCOUNTER — Other Ambulatory Visit (HOSPITAL_COMMUNITY): Payer: Self-pay

## 2023-12-02 ENCOUNTER — Encounter (INDEPENDENT_AMBULATORY_CARE_PROVIDER_SITE_OTHER): Payer: Self-pay | Admitting: Family Medicine

## 2023-12-02 VITALS — BP 120/70 | HR 66 | Temp 98.2°F | Ht 69.0 in | Wt 278.0 lb

## 2023-12-02 DIAGNOSIS — G4733 Obstructive sleep apnea (adult) (pediatric): Secondary | ICD-10-CM | POA: Diagnosis not present

## 2023-12-02 DIAGNOSIS — Z6841 Body Mass Index (BMI) 40.0 and over, adult: Secondary | ICD-10-CM

## 2023-12-02 MED ORDER — ZEPBOUND 7.5 MG/0.5ML ~~LOC~~ SOAJ
7.5000 mg | SUBCUTANEOUS | 0 refills | Status: DC
  Filled 2023-12-02: qty 2, 28d supply, fill #0

## 2023-12-02 NOTE — Progress Notes (Signed)
 SUBJECTIVE:  Chief Complaint: Obesity  Interim History: Patient started doing [employer] and she has been able to stick to her meal plan more consistently.  This has fixed the dinner prep and decreased the eating out.  She mentions that she feels like this has been very beneficial for her.  She is going to [place] tomorrow for a cousins trip and is watching [employer] play.  She is excited. Next few weeks she wants to continue with the same meal plan which is a hybrid of category 3 and [employer] prep dinners. She is going to be able to be consistent with her activity and doesn't have any work trips for the next few weeks.   Robert is here to discuss her progress with her obesity treatment plan. She is on the Category 3 Plan and keeping a food journal and adhering to recommended goals of 1500 calories and 100 grams of protein and states she is following her eating plan approximately 85 % of the time. She states she is exercising 45 minutes 3 times per week- doing a combination of strength and cardio.   OBJECTIVE: Visit Diagnoses: Problem List Items Addressed This Visit       Respiratory   OSA on CPAP   Relevant Medications   tirzepatide  (ZEPBOUND ) 7.5 MG/0.5ML Pen   Other Visit Diagnoses       BMI 40.0-44.9, adult (HCC) Current BMI41.7    -  Primary   Relevant Medications   tirzepatide  (ZEPBOUND ) 7.5 MG/0.5ML Pen     Morbid obesity (HCC)       Relevant Medications   tirzepatide  (ZEPBOUND ) 7.5 MG/0.5ML Pen       Vitals Temp: 98.2 F (36.8 C) BP: 120/70 Pulse Rate: 66 SpO2: 100 %   Anthropometric Measurements Height: 5' 9 (1.753 m) Weight: 278 lb (126.1 kg) BMI (Calculated): 41.03 Weight at Last Visit: 285 lb Weight Lost Since Last Visit: 7 Weight Gained Since Last Visit: 0 Starting Weight: 305 lb   Body Composition  Body Fat %: 44.2 % Fat Mass (lbs): 123 lbs Muscle Mass (lbs): 147.4 lbs Total Body Water (lbs): 95.2 lbs Visceral Fat Rating :  14   Other Clinical Data Today's Visit #: 19 Starting Date: 08/26/22 Comments: Cat 3, 1500/100     ASSESSMENT AND PLAN: Assessment & Plan Morbid obesity (HCC)  OSA on CPAP Patient is tolerating Zepbound  7.5 mg.  She is still using her CPAP and the combination of CPAP plus Zepbound  has allowed patient to experience better sleep in addition to weight loss.  Refill Zepbound  at 7.5 mg subcutaneous weekly. BMI 40.0-44.9, adult (HCC) Current BMI41.7    Diet: Dallis is currently in the action stage of change. As such, her goal is to continue with weight loss efforts and has agreed to the Category 3 Plan and keeping a food journal and adhering to recommended goals of 450-600 calories and 40 or more grams of protein at supper.   Exercise:  For substantial health benefits, adults should do at least 150 minutes (2 hours and 30 minutes) a week of moderate-intensity, or 75 minutes (1 hour and 15 minutes) a week of vigorous-intensity aerobic physical activity, or an equivalent combination of moderate- and vigorous-intensity aerobic activity. Aerobic activity should be performed in episodes of at least 10 minutes, and preferably, it should be spread throughout the week.  Behavior Modification:  We discussed the following Behavioral Modification Strategies today: increasing lean protein intake, decreasing simple carbohydrates, increasing vegetables, meal  planning and cooking strategies, travel eating strategies, and planning for success. We discussed various medication options to help Ciella with her weight loss efforts and we both agreed to continue zepbound  at current dose.  Return in about 4 weeks (around 12/30/2023).   She was informed of the importance of frequent follow up visits to maximize her success with intensive lifestyle modifications for her multiple health conditions.  Attestation Statements:   Reviewed by clinician on day of visit: allergies, medications, problem list, medical  history, surgical history, family history, social history, and previous encounter notes.     [name], MD

## 2023-12-08 NOTE — Assessment & Plan Note (Signed)
 Patient is tolerating Zepbound  7.5 mg.  She is still using her CPAP and the combination of CPAP plus Zepbound  has allowed patient to experience better sleep in addition to weight loss.  Refill Zepbound  at 7.5 mg subcutaneous weekly.

## 2023-12-30 ENCOUNTER — Other Ambulatory Visit (HOSPITAL_COMMUNITY): Payer: Self-pay

## 2023-12-30 ENCOUNTER — Encounter (INDEPENDENT_AMBULATORY_CARE_PROVIDER_SITE_OTHER): Payer: Self-pay | Admitting: Family Medicine

## 2023-12-30 ENCOUNTER — Ambulatory Visit (INDEPENDENT_AMBULATORY_CARE_PROVIDER_SITE_OTHER): Admitting: Family Medicine

## 2023-12-30 VITALS — BP 114/77 | HR 74 | Temp 98.6°F | Ht 69.0 in | Wt 275.0 lb

## 2023-12-30 DIAGNOSIS — Z6841 Body Mass Index (BMI) 40.0 and over, adult: Secondary | ICD-10-CM | POA: Diagnosis not present

## 2023-12-30 DIAGNOSIS — E7849 Other hyperlipidemia: Secondary | ICD-10-CM | POA: Diagnosis not present

## 2023-12-30 DIAGNOSIS — G4733 Obstructive sleep apnea (adult) (pediatric): Secondary | ICD-10-CM

## 2023-12-30 MED ORDER — ZEPBOUND 10 MG/0.5ML ~~LOC~~ SOAJ
10.0000 mg | SUBCUTANEOUS | 0 refills | Status: DC
  Filled 2023-12-30: qty 2, 28d supply, fill #0

## 2023-12-30 NOTE — Assessment & Plan Note (Signed)
 Sleep has been worsening due to hunger and nighttime awakenings.  Will increase Zepbound  to 10mg  to help.  New RX sent to pharmacy.

## 2023-12-30 NOTE — Progress Notes (Signed)
 SUBJECTIVE:  Chief Complaint: Obesity  Interim History: Patient has been consistent with her meal plan the last 3 weeks.  Food noise has increased and she may have snacked more.  She is finding it is taking her longer to feel fuller and she is waking up very hungry.  She is getting around 40g of protein at supper.  She thinks it may be time to increase her Zepbound .  She unfortunately has fallen in the habit of waking up in the middle of the night and then struggling to get back to sleep prior to going to the gym.  She is going to [place] next week then the weekend after that is [employer] homecoming.  The week after that she is going to [facility] to present at a conference.    Katherine Conway is here to discuss her progress with her obesity treatment plan. She is on the Category 3 Plan and keeping a food journal and adhering to recommended goals of 450-600 calories and 40 grams of protein for dinner and states she is following her eating plan approximately 85 % of the time. She states she is exercising 45 minutes 4 times per week.   OBJECTIVE: Visit Diagnoses: Problem List Items Addressed This Visit       Respiratory   OSA on CPAP - Primary   Relevant Medications   tirzepatide  (ZEPBOUND ) 10 MG/0.5ML Pen   Other Visit Diagnoses       Morbid obesity (HCC)       Relevant Medications   tirzepatide  (ZEPBOUND ) 10 MG/0.5ML Pen     BMI 40.0-44.9, adult (HCC) Current BMI41.7       Relevant Medications   tirzepatide  (ZEPBOUND ) 10 MG/0.5ML Pen       Vitals Temp: 98.6 F (37 C) BP: 114/77 Pulse Rate: 74 SpO2: 98 %   Anthropometric Measurements Height: 5' 9 (1.753 m) Weight: 275 lb (124.7 kg) BMI (Calculated): 40.59 Weight at Last Visit: 278 lb Weight Lost Since Last Visit: 3 Weight Gained Since Last Visit: 0 Starting Weight: 305 lb Total Weight Loss (lbs): 27 lb (12.2 kg)   Body Composition  Body Fat %: 45.1 % Fat Mass (lbs): 124.4 lbs Muscle Mass (lbs): 143.8 lbs Total Body Water  (lbs): 98.8 lbs Visceral Fat Rating : 14   Other Clinical Data Today's Visit #: 20 Starting Date: 08/26/22 Comments: Cat 3, 450-600/40     ASSESSMENT AND PLAN: Assessment & Plan Other hyperlipidemia LDL better controlled on last labs.  She has appt with PCP in November and will get labs at that time.  Follow up on results after that.  OSA on CPAP Sleep has been worsening due to hunger and nighttime awakenings.  Will increase Zepbound  to 10mg  to help.  New RX sent to pharmacy. Morbid obesity (HCC)  BMI 40.0-44.9, adult (HCC) Current BMI41.7    Diet: Katherine Conway is currently in the action stage of change. As such, her goal is to continue with weight loss efforts and has agreed to the Category 3 Plan and keeping a food journal and adhering to recommended goals of 450-600 calories and 40 or more grams of protein.  Have premade meals week of homecoming and the weeks of traveling will have protein shakes to keep nutrition up.  Exercise:  For substantial health benefits, adults should do at least 150 minutes (2 hours and 30 minutes) a week of moderate-intensity, or 75 minutes (1 hour and 15 minutes) a week of vigorous-intensity aerobic physical activity, or an equivalent combination of moderate-  and vigorous-intensity aerobic activity. Aerobic activity should be performed in episodes of at least 10 minutes, and preferably, it should be spread throughout the week.  Patient to continue to focus on strength training and hotel room exercises for the next 4 week.  Behavior Modification:  We discussed the following Behavioral Modification Strategies today: increasing lean protein intake, decreasing simple carbohydrates, increasing vegetables, meal planning and cooking strategies, and planning for success. We discussed various medication options to help Katherine Conway with her weight loss efforts and we both agreed to increase Zepbound  to 10mg  to see if this assists with her food noise.  Return in about 4  weeks (around 01/27/2024).   She was informed of the importance of frequent follow up visits to maximize her success with intensive lifestyle modifications for her multiple health conditions.  Attestation Statements:   Reviewed by clinician on day of visit: allergies, medications, problem list, medical history, surgical history, family history, social history, and previous encounter notes.   [name], MD

## 2023-12-30 NOTE — Assessment & Plan Note (Signed)
 LDL better controlled on last labs.  She has appt with PCP in November and will get labs at that time.  Follow up on results after that.

## 2024-01-06 ENCOUNTER — Encounter (INDEPENDENT_AMBULATORY_CARE_PROVIDER_SITE_OTHER): Payer: Self-pay

## 2024-01-12 ENCOUNTER — Other Ambulatory Visit: Payer: Self-pay | Admitting: Obstetrics and Gynecology

## 2024-01-12 ENCOUNTER — Other Ambulatory Visit: Payer: Self-pay | Admitting: Internal Medicine

## 2024-01-12 DIAGNOSIS — Z1231 Encounter for screening mammogram for malignant neoplasm of breast: Secondary | ICD-10-CM

## 2024-02-01 ENCOUNTER — Other Ambulatory Visit (HOSPITAL_COMMUNITY): Payer: Self-pay

## 2024-02-01 ENCOUNTER — Ambulatory Visit (INDEPENDENT_AMBULATORY_CARE_PROVIDER_SITE_OTHER): Admitting: Family Medicine

## 2024-02-01 VITALS — BP 136/83 | HR 62 | Temp 98.2°F | Ht 69.0 in | Wt 275.0 lb

## 2024-02-01 DIAGNOSIS — G4733 Obstructive sleep apnea (adult) (pediatric): Secondary | ICD-10-CM | POA: Diagnosis not present

## 2024-02-01 DIAGNOSIS — Z6841 Body Mass Index (BMI) 40.0 and over, adult: Secondary | ICD-10-CM

## 2024-02-01 DIAGNOSIS — E559 Vitamin D deficiency, unspecified: Secondary | ICD-10-CM

## 2024-02-01 MED ORDER — ZEPBOUND 12.5 MG/0.5ML ~~LOC~~ SOAJ
12.5000 mg | SUBCUTANEOUS | 0 refills | Status: DC
  Filled 2024-02-01: qty 2, 28d supply, fill #0

## 2024-02-01 NOTE — Progress Notes (Signed)
 SUBJECTIVE:  Chief Complaint: Obesity  Interim History: Since last appointment patient had a few trips and homecoming weekend.  She went to [place] and [place] and then had house guests and got completely out of her routine.  She is going to a party for Halloween and is planning a trip to [place]  next week for a 3 days.  Then for Thanksgiving she is planning to go to [place] for her cousins house. Wants to stay committed to Category 3 over the next few weeks.  Breakfast and lunch are relatively easy to stay consistent with and for dinner she is doing [employer] options.   Katherine Conway is here to discuss her progress with her obesity treatment plan. She is on the Category 3 Plan and keeping a food journal and adhering to recommended goals of 450-600 calories and 40 grams of protein and states she is following her eating plan approximately 50 % of the time. She states she is exercising 45 minutes 2 times per week.   OBJECTIVE: Visit Diagnoses: Problem List Items Addressed This Visit       Respiratory   OSA on CPAP - Primary   Relevant Medications   tirzepatide  (ZEPBOUND ) 12.5 MG/0.5ML Pen   Other Visit Diagnoses       Morbid obesity (HCC)       Relevant Medications   tirzepatide  (ZEPBOUND ) 12.5 MG/0.5ML Pen     BMI 40.0-44.9, adult (HCC) Current BMI41.7       Relevant Medications   tirzepatide  (ZEPBOUND ) 12.5 MG/0.5ML Pen       Vitals Temp: 98.2 F (36.8 C) BP: 136/83 Pulse Rate: 62 SpO2: 100 %   Anthropometric Measurements Height: 5' 9 (1.753 m) Weight: 275 lb (124.7 kg) BMI (Calculated): 40.59 Weight at Last Visit: 275 lb Weight Lost Since Last Visit: 0 Weight Gained Since Last Visit: 0 Starting Weight: 305 lb Total Weight Loss (lbs): 30 lb (13.6 kg)   Body Composition  Body Fat %: 48 % Fat Mass (lbs): 132 lbs Muscle Mass (lbs): 135.8 lbs Total Body Water (lbs): 94.4 lbs Visceral Fat Rating : 15   Other Clinical Data Today's Visit #:  21 Starting Date: 08/26/22 Comments: Cat 3, 450-600/40     ASSESSMENT AND PLAN: Assessment & Plan OSA on CPAP Patient reports she is still using CPAP nightly and is sleeping well with it.  She does feel like the dosage of the Zepbound  could be increased to better help manage.  Will increase Zepbound  to 12.5mg  weekly.  Vitamin D  deficiency Last vitamin D  level at goal in December of 2024.  Patient is taking OTC Vitamin D  daily. Morbid obesity (HCC)  BMI 40.0-44.9, adult (HCC) Current BMI41.7    Diet: Katherine Conway is currently in the action stage of change. As such, her goal is to continue with weight loss efforts and has agreed to the Category 3 Plan and keeping a food journal and adhering to recommended goals of 450-550 calories and 40 or more grams of protein for dinner.  Exercise:  For substantial health benefits, adults should do at least 150 minutes (2 hours and 30 minutes) a week of moderate-intensity, or 75 minutes (1 hour and 15 minutes) a week of vigorous-intensity aerobic physical activity, or an equivalent combination of moderate- and vigorous-intensity aerobic activity. Aerobic activity should be performed in episodes of at least 10 minutes, and preferably, it should be spread throughout the week.  Behavior Modification:  We discussed the following Behavioral Modification Strategies today: increasing lean protein  intake, decreasing simple carbohydrates, increasing vegetables, meal planning and cooking strategies, keeping healthy foods in the home, and planning for success.  Holiday recipe handout given today We discussed various medication options to help Katherine Conway with her weight loss efforts and we both agreed to continue Zepbound  at current dose.  Return in about 4 weeks (around 02/29/2024).   She was informed of the importance of frequent follow up visits to maximize her success with intensive lifestyle modifications for her multiple health conditions.  Attestation Statements:    Reviewed by clinician on day of visit: allergies, medications, problem list, medical history, surgical history, family history, social history, and previous encounter notes.   Katherine Cho, MD

## 2024-02-01 NOTE — Assessment & Plan Note (Addendum)
 Patient reports she is still using CPAP nightly and is sleeping well with it.  She does feel like the dosage of the Zepbound  could be increased to better help manage.  Will increase Zepbound  to 12.5mg  weekly.

## 2024-02-01 NOTE — Assessment & Plan Note (Signed)
 Last vitamin D  level at goal in December of 2024.  Patient is taking OTC Vitamin D  daily.

## 2024-02-15 ENCOUNTER — Ambulatory Visit: Payer: Self-pay | Admitting: Internal Medicine

## 2024-02-15 VITALS — BP 108/70 | HR 70 | Temp 98.3°F | Ht 69.0 in | Wt 281.2 lb

## 2024-02-15 DIAGNOSIS — G4733 Obstructive sleep apnea (adult) (pediatric): Secondary | ICD-10-CM

## 2024-02-15 DIAGNOSIS — E559 Vitamin D deficiency, unspecified: Secondary | ICD-10-CM

## 2024-02-15 DIAGNOSIS — Z6841 Body Mass Index (BMI) 40.0 and over, adult: Secondary | ICD-10-CM

## 2024-02-15 DIAGNOSIS — Z23 Encounter for immunization: Secondary | ICD-10-CM

## 2024-02-15 DIAGNOSIS — R7303 Prediabetes: Secondary | ICD-10-CM

## 2024-02-15 DIAGNOSIS — E66813 Obesity, class 3: Secondary | ICD-10-CM

## 2024-02-15 NOTE — Assessment & Plan Note (Signed)
 Previous labs reviewed, her A1c has been elevated in the past. I will check an A1c today. Reminded to avoid refined sugars including sugary drinks/foods and processed meats including bacon, sausages and deli meats.

## 2024-02-15 NOTE — Assessment & Plan Note (Signed)
 BMI 41, currently on GLP-1 agonist therapy. Encouraged to optimize her protein intake and to continue with regular exercise. She has incorporated strength training into her regular exercise routine.  - Optimize protein intake - Continue regular exercise and strength training.

## 2024-02-15 NOTE — Patient Instructions (Signed)
 Cholesterol Content in Foods Cholesterol is a waxy, fat-like substance that helps to carry fat in the blood. The body needs cholesterol in small amounts, but too much cholesterol can cause damage to the arteries and heart. What foods have cholesterol?  Cholesterol is found in animal-based foods, such as meat, seafood, and dairy. Generally, low-fat dairy and lean meats have less cholesterol than full-fat dairy and fatty meats. The milligrams of cholesterol per serving (mg per serving) of common cholesterol-containing foods are listed below. Meats and other proteins Egg -- one large whole egg has 186 mg. Veal shank -- 4 oz (113 g) has 141 mg. Lean ground [place] (93% lean) -- 4 oz (113 g) has 118 mg. Fat-trimmed lamb loin -- 4 oz (113 g) has 106 mg. Lean ground beef (90% lean) -- 4 oz (113 g) has 100 mg. Lobster -- 3.5 oz (99 g) has 90 mg. Pork loin chops -- 4 oz (113 g) has 86 mg. Canned salmon -- 3.5 oz (99 g) has 83 mg. Fat-trimmed beef top loin -- 4 oz (113 g) has 78 mg. Frankfurter -- 1 frank (3.5 oz or 99 g) has 77 mg. Crab -- 3.5 oz (99 g) has 71 mg. Roasted chicken without skin, white meat -- 4 oz (113 g) has 66 mg. Light bologna -- 2 oz (57 g) has 45 mg. Deli-cut [place] -- 2 oz (57 g) has 31 mg. Canned tuna -- 3.5 oz (99 g) has 31 mg. Tomasa Blase -- 1 oz (28 g) has 29 mg. Oysters and mussels (raw) -- 3.5 oz (99 g) has 25 mg. Mackerel -- 1 oz (28 g) has 22 mg. Trout -- 1 oz (28 g) has 20 mg. Pork sausage -- 1 link (1 oz or 28 g) has 17 mg. Salmon -- 1 oz (28 g) has 16 mg. Tilapia -- 1 oz (28 g) has 14 mg. Dairy Soft-serve ice cream --  cup (4 oz or 86 g) has 103 mg. Whole-milk yogurt -- 1 cup (8 oz or 245 g) has 29 mg. Cheddar cheese -- 1 oz (28 g) has 28 mg. American cheese -- 1 oz (28 g) has 28 mg. Whole milk -- 1 cup (8 oz or 250 mL) has 23 mg. 2% milk -- 1 cup (8 oz or 250 mL) has 18 mg. Cream cheese -- 1 tablespoon (Tbsp) (14.5 g) has 15 mg. Cottage cheese --  cup (4 oz or  113 g) has 14 mg. Low-fat (1%) milk -- 1 cup (8 oz or 250 mL) has 10 mg. Sour cream -- 1 Tbsp (12 g) has 8.5 mg. Low-fat yogurt -- 1 cup (8 oz or 245 g) has 8 mg. Nonfat Greek yogurt -- 1 cup (8 oz or 228 g) has 7 mg. Half-and-half cream -- 1 Tbsp (15 mL) has 5 mg. Fats and oils Cod liver oil -- 1 tablespoon (Tbsp) (13.6 g) has 82 mg. Butter -- 1 Tbsp (14 g) has 15 mg. Lard -- 1 Tbsp (12.8 g) has 14 mg. Bacon grease -- 1 Tbsp (12.9 g) has 14 mg. Mayonnaise -- 1 Tbsp (13.8 g) has 5-10 mg. Margarine -- 1 Tbsp (14 g) has 3-10 mg. The items listed above may not be a complete list of foods with cholesterol. Exact amounts of cholesterol in these foods may vary depending on specific ingredients and brands. Contact a dietitian for more information. What foods do not have cholesterol? Most plant-based foods do not have cholesterol unless you combine them with a food that has  cholesterol. Foods without cholesterol include: Grains and cereals. Vegetables. Fruits. Vegetable oils, such as olive, canola, and sunflower oil. Legumes, such as peas, beans, and lentils. Nuts and seeds. Egg whites. The items listed above may not be a complete list of foods that do not have cholesterol. Contact a dietitian for more information. Summary The body needs cholesterol in small amounts, but too much cholesterol can cause damage to the arteries and heart. Cholesterol is found in animal-based foods, such as meat, seafood, and dairy. Generally, low-fat dairy and lean meats have less cholesterol than full-fat dairy and fatty meats. This information is not intended to replace advice given to you by your health care provider. Make sure you discuss any questions you have with your health care provider. Document Revised: 08/03/2020 Document Reviewed: 08/03/2020 Elsevier Patient Education  2024 [employer].

## 2024-02-15 NOTE — Assessment & Plan Note (Signed)
 I WILL CHECK A VIT D LEVEL AND SUPPLEMENT AS NEEDED.  ALSO ENCOURAGED TO SPEND 15 MINUTES IN THE SUN DAILY.

## 2024-02-15 NOTE — Progress Notes (Signed)
 I,Katherine Conway, CMA,acting as a neurosurgeon for Katherine LOISE Slocumb, MD.,have documented all relevant documentation on the behalf of Katherine LOISE Slocumb, MD,as directed by  Katherine LOISE Slocumb, MD while in the presence of Katherine LOISE Slocumb, MD.  Subjective:  Patient ID: Katherine Conway , female    DOB: [birth year] , 53 y.o.   MRN: [id number]  Chief Complaint  Patient presents with   Cholesterol Follow Up     Patient presents today for chol f/u. She currently does not take any prescribed medications. Denies headache, chest pain & sob. She has no specific questions or concerns.  Pap scheduled for Dec.      HPI Discussed the use of AI scribe software for clinical note transcription with the patient, who gave verbal consent to proceed.  History of Present Illness Katherine Conway is a 53 year old female who presents for a routine follow-up visit.  She is here to f/u prediabetes.  Her medication, Zepbound , was recently increased to 12.5 mg. She is optimizing her protein intake and engaging in strength training about three to four times a week. She is also using a CPAP machine for her sleep apnea.  She has a family history of heart disease. A cardiac calcium score test showed a score of zero. Additionally, a genetic test for predisposition to heart disease was performed in May, which came back less than 75.  She received a flu shot today and inquired about the COVID-19 vaccine. She is currently taking montelukast  and cetirizine for allergies.  She takes vitamin D  supplements, recently switching to a gummy form, with a dose of 1000 IU. She recently traveled to [place]  and is planning a trip to [place], [place]  at the end of next week.    Past Medical History:  Diagnosis Date   Allergy    Edema of optic nerve    Osteoarthritis    Sleep apnea    Vitamin D  deficiency      Family History  Problem Relation Age of Onset   Hypertension Mother    Obesity Mother    Obesity Father    Hyperlipidemia  Father    Hypertension Father    Hypertension Maternal Grandmother    Hypertension Maternal Grandfather    Hypertension Paternal Grandmother    Heart disease Paternal Grandfather    Hypertension Paternal Grandfather    Breast cancer Maternal Aunt      Current Outpatient Medications:    cetirizine (ZYRTEC) 10 MG tablet, , Disp: , Rfl:    montelukast  (SINGULAIR ) 10 MG tablet, TAKE 1 TABLET BY MOUTH EVERY DAY, Disp: 30 tablet, Rfl: 8   Nutritional Supplements (NUTRITIONAL SUPPLEMENT PO), Take by mouth. Liver Focus, Disp: , Rfl:    NUTRITIONAL SUPPLEMENTS PO, Take by mouth. Enzymes, Disp: , Rfl:    tirzepatide  (ZEPBOUND ) 12.5 MG/0.5ML Pen, Inject 12.5 mg into the skin once a week., Disp: 2 mL, Rfl: 0   Turmeric (QC TUMERIC COMPLEX PO), Take by mouth. Prana Tumeric and Ginger Extract, Disp: , Rfl:    VITAMIN D  PO, Take 1,000 Units by mouth daily., Disp: , Rfl:    No Known Allergies   Review of Systems  Constitutional: Negative.   Respiratory: Negative.    Cardiovascular: Negative.   Gastrointestinal: Negative.   Neurological: Negative.   Psychiatric/Behavioral: Negative.       Today's Vitals   02/15/24 0848  BP: 108/70  Pulse: 70  Temp: 98.3 F (36.8 C)  SpO2: 98%  Weight: 281 lb  3.2 oz (127.6 kg)  Height: 5' 9 (1.753 m)   Body mass index is 41.53 kg/m.  Wt Readings from Last 3 Encounters:  02/15/24 281 lb 3.2 oz (127.6 kg)  02/01/24 275 lb (124.7 kg)  12/30/23 275 lb (124.7 kg)     Objective:  Physical Exam Vitals and nursing note reviewed.  Constitutional:      Appearance: Normal appearance. She is obese.  HENT:     Head: Normocephalic and atraumatic.  Eyes:     Extraocular Movements: Extraocular movements intact.  Cardiovascular:     Rate and Rhythm: Normal rate and regular rhythm.     Heart sounds: Normal heart sounds.  Pulmonary:     Effort: Pulmonary effort is normal.     Breath sounds: Normal breath sounds.  Musculoskeletal:     Cervical back:  Normal range of motion.  Skin:    General: Skin is warm.  Neurological:     General: No focal deficit present.     Mental Status: She is alert.  Psychiatric:        Mood and Affect: Mood normal.        Behavior: Behavior normal.         Assessment And Plan:  Prediabetes Assessment & Plan: Previous labs reviewed, her A1c has been elevated in the past. I will check an A1c today. Reminded to avoid refined sugars including sugary drinks/foods and processed meats including bacon, sausages and deli meats.    Orders: -     CMP14+EGFR -     Lipid panel -     Hemoglobin A1c  OSA on CPAP Assessment & Plan: Chronic, currently on Zepbound  as per [facility]. She has recently increased to the 12.5mg  weekly dose.  Thus far, she has had no issues.  Encouraged to wear CPAP at least four hours per night.    Vitamin D  deficiency disease Assessment & Plan: I WILL CHECK A VIT D LEVEL AND SUPPLEMENT AS NEEDED.  ALSO ENCOURAGED TO SPEND 15 MINUTES IN THE SUN DAILY.   Orders: -     VITAMIN D  25 Hydroxy (Vit-D Deficiency, Fractures)  Class 3 severe obesity due to excess calories without serious comorbidity with body mass index (BMI) of 40.0 to 44.9 in adult [facility]) Assessment & Plan: BMI 41, currently on GLP-1 agonist therapy. Encouraged to optimize her protein intake and to continue with regular exercise. She has incorporated strength training into her regular exercise routine.  - Optimize protein intake - Continue regular exercise and strength training.   Immunization due -     Flu vaccine trivalent PF, 6mos and older(Flulaval,Afluria,Fluarix,Fluzone)   Return if symptoms worsen or fail to improve.  Patient was given opportunity to ask questions. Patient verbalized understanding of the plan and was able to repeat key elements of the plan. All questions were answered to their satisfaction.   I, Katherine LOISE Slocumb, MD, have reviewed all documentation for this visit. The documentation on 02/15/24  for the exam, diagnosis, procedures, and orders are all accurate and complete.   IF YOU HAVE BEEN REFERRED TO A SPECIALIST, IT MAY TAKE 1-2 WEEKS TO SCHEDULE/PROCESS THE REFERRAL. IF YOU HAVE NOT HEARD FROM US /SPECIALIST IN TWO WEEKS, PLEASE GIVE US  A CALL AT [phone].   THE PATIENT IS ENCOURAGED TO PRACTICE SOCIAL DISTANCING DUE TO THE COVID-19 PANDEMIC.

## 2024-02-15 NOTE — Assessment & Plan Note (Signed)
 Chronic, currently on Zepbound  as per [facility]. She has recently increased to the 12.5mg  weekly dose.  Thus far, she has had no issues.  Encouraged to wear CPAP at least four hours per night.

## 2024-02-16 ENCOUNTER — Ambulatory Visit: Payer: Self-pay | Admitting: Internal Medicine

## 2024-02-16 LAB — LIPID PANEL
Chol/HDL Ratio: 3.1 ratio (ref 0.0–4.4)
Cholesterol, Total: 171 mg/dL (ref 100–199)
HDL: 56 mg/dL (ref >39)
LDL Chol Calc (NIH): 106 mg/dL — ABNORMAL HIGH (ref 0–99)
Triglycerides: 45 mg/dL (ref 0–149)
VLDL Cholesterol Cal: 9 mg/dL (ref 5–40)

## 2024-02-16 LAB — CMP14+EGFR
ALT: 19 IU/L (ref 0–32)
AST: 17 IU/L (ref 0–40)
Albumin: 4.3 g/dL (ref 3.8–4.9)
Alkaline Phosphatase: 76 IU/L (ref 49–135)
BUN/Creatinine Ratio: 20 (ref 9–23)
BUN: 17 mg/dL (ref 6–24)
Bilirubin Total: 0.6 mg/dL (ref 0.0–1.2)
CO2: 22 mmol/L (ref 20–29)
Calcium: 8.6 mg/dL — ABNORMAL LOW (ref 8.7–10.2)
Chloride: 108 mmol/L — ABNORMAL HIGH (ref 96–106)
Creatinine, Ser: 0.86 mg/dL (ref 0.57–1.00)
Globulin, Total: 2.4 g/dL (ref 1.5–4.5)
Glucose: 87 mg/dL (ref 70–99)
Potassium: 4.5 mmol/L (ref 3.5–5.2)
Sodium: 141 mmol/L (ref 134–144)
Total Protein: 6.7 g/dL (ref 6.0–8.5)
eGFR: 81 mL/min/1.73 (ref >59)

## 2024-02-16 LAB — HEMOGLOBIN A1C
Est. average glucose Bld gHb Est-mCnc: 103 mg/dL
Hgb A1c MFr Bld: 5.2 % (ref 4.8–5.6)

## 2024-02-16 LAB — VITAMIN D 25 HYDROXY (VIT D DEFICIENCY, FRACTURES): Vit D, 25-Hydroxy: 54.1 ng/mL (ref 30.0–100.0)

## 2024-03-07 ENCOUNTER — Telehealth (HOSPITAL_COMMUNITY): Payer: Self-pay

## 2024-03-07 ENCOUNTER — Encounter (INDEPENDENT_AMBULATORY_CARE_PROVIDER_SITE_OTHER): Payer: Self-pay | Admitting: Family Medicine

## 2024-03-07 ENCOUNTER — Other Ambulatory Visit (HOSPITAL_COMMUNITY): Payer: Self-pay

## 2024-03-07 ENCOUNTER — Ambulatory Visit (INDEPENDENT_AMBULATORY_CARE_PROVIDER_SITE_OTHER): Payer: Self-pay | Admitting: Family Medicine

## 2024-03-07 ENCOUNTER — Telehealth (HOSPITAL_COMMUNITY): Payer: Self-pay | Admitting: Pharmacist

## 2024-03-07 VITALS — BP 114/77 | HR 90 | Temp 98.3°F | Ht 69.0 in | Wt 279.0 lb

## 2024-03-07 DIAGNOSIS — Z6841 Body Mass Index (BMI) 40.0 and over, adult: Secondary | ICD-10-CM | POA: Diagnosis not present

## 2024-03-07 DIAGNOSIS — G4733 Obstructive sleep apnea (adult) (pediatric): Secondary | ICD-10-CM | POA: Diagnosis not present

## 2024-03-07 MED ORDER — ZEPBOUND 12.5 MG/0.5ML ~~LOC~~ SOAJ
12.5000 mg | SUBCUTANEOUS | 0 refills | Status: DC
  Filled 2024-03-07: qty 2, 28d supply, fill #0

## 2024-03-07 MED ORDER — ZEPBOUND 12.5 MG/0.5ML ~~LOC~~ SOAJ
12.5000 mg | SUBCUTANEOUS | 0 refills | Status: AC
  Filled 2024-03-07: qty 2, 28d supply, fill #0
  Filled 2024-03-07 – 2024-03-08 (×3): qty 6, 84d supply, fill #0
  Filled 2024-03-09: qty 2, 28d supply, fill #0
  Filled 2024-04-01: qty 2, 28d supply, fill #1
  Filled 2024-05-06: qty 2, 28d supply, fill #2

## 2024-03-07 NOTE — Telephone Encounter (Signed)
 PA request has been Received. New Encounter has been or will be created for follow up. For additional info see Pharmacy Prior Auth telephone encounter from 03/07/24.

## 2024-03-07 NOTE — Telephone Encounter (Signed)
 Pharmacy Patient Advocate Encounter   Received notification from Pt Calls Messages that prior authorization for Zepbound  12.5MG /0.5ML pen-injectors  is required/requested.   Insurance verification completed.   The patient is insured through [facility].   Per test claim: PA required; PA submitted to above mentioned insurance via Latent Key/confirmation #/EOC [id number] Status is pending

## 2024-03-07 NOTE — Progress Notes (Unsigned)
 SUBJECTIVE:  Chief Complaint: Obesity  Interim History: Patient had a great Thanksgiving.  She mentions she felt the last month was a perfect storm of travel, holiday, indulgent eating.  She also has been without Zepbound  for a week.  Felt the dosage of zepbound  was sufficient for helping control her intake.  She is home for the next month and is really committed to getting and staying on a consistent routine over the next month.  She and her family travel the week after Christmas so wants to take advantage of the next 3 weeks of being home and having control.  Just recently traveled to [place]  and [place] .    Katherine Conway is here to discuss her progress with her obesity treatment plan. She is on the Category 3 Plan and keeping a food journal and adhering to recommended goals of 450-550 calories and 40 protein and states she is following her eating plan approximately 30 % of the time. She states she is exercising 45 minutes 2 times per week.   OBJECTIVE: Visit Diagnoses: Problem List Items Addressed This Visit       Respiratory   OSA on CPAP - Primary   Relevant Medications   tirzepatide  (ZEPBOUND ) 12.5 MG/0.5ML Pen   Other Visit Diagnoses       BMI 40.0-44.9, adult (HCC) Current BMI41.7       Relevant Medications   tirzepatide  (ZEPBOUND ) 12.5 MG/0.5ML Pen     Morbid obesity (HCC)       Relevant Medications   tirzepatide  (ZEPBOUND ) 12.5 MG/0.5ML Pen       Vitals Temp: 98.3 F (36.8 C) BP: 114/77 Pulse Rate: 90 SpO2: 100 %   Anthropometric Measurements Height: 5' 9 (1.753 m) Weight: 279 lb (126.6 kg) BMI (Calculated): 41.18 Weight at Last Visit: 275 lb Weight Lost Since Last Visit: 0 Weight Gained Since Last Visit: 4 Starting Weight: 305 lb Total Weight Loss (lbs): 26 lb (11.8 kg)   Body Composition  Body Fat %: 48.7 % Fat Mass (lbs): 136 lbs Muscle Mass (lbs): 136 lbs Total Body Water (lbs): 96.6 lbs Visceral Fat Rating : 15   Other Clinical  Data Today's Visit #: 22 Starting Date: 08/26/22 Comments: Cat 3, 450-550/40     ASSESSMENT AND PLAN: Assessment & Plan OSA on CPAP Patient reports that she has been able to be consistent with her usage of CPAP as well as Zepbound .  She needs a refill of the Zepbound  today.  Denies any significant GI side effects such as nausea, diarrhea, or constipation.  Will continue on current dose of Zepbound . BMI 40.0-44.9, adult (HCC) Current BMI41.7  Morbid obesity (HCC)    Diet: Katherine Conway is currently in the action stage of change. As such, her goal is to continue with weight loss efforts and has agreed to the Category 3 Plan and keeping a food journal and adhering to recommended goals of 450-550 calories calories and 40 or more grams protein for dinner.   Exercise:  For substantial health benefits, adults should do at least 150 minutes (2 hours and 30 minutes) a week of moderate-intensity, or 75 minutes (1 hour and 15 minutes) a week of vigorous-intensity aerobic physical activity, or an equivalent combination of moderate- and vigorous-intensity aerobic activity. Aerobic activity should be performed in episodes of at least 10 minutes, and preferably, it should be spread throughout the week.  Behavior Modification:  We discussed the following Behavioral Modification Strategies today: increasing lean protein intake, decreasing simple carbohydrates, increasing vegetables, meal planning  and cooking strategies, holiday eating strategies, and planning for success. We discussed various medication options to help Katherine Conway with her weight loss efforts and we both agreed to continue zepbound  at current dose.  Return in about 4 weeks (around 04/04/2024).   She was informed of the importance of frequent follow up visits to maximize her success with intensive lifestyle modifications for her multiple health conditions.  Attestation Statements:   Reviewed by clinician on day of visit: allergies, medications,  problem list, medical history, surgical history, family history, social history, and previous encounter notes.   Katherine Cho, MD

## 2024-03-08 ENCOUNTER — Encounter (INDEPENDENT_AMBULATORY_CARE_PROVIDER_SITE_OTHER): Payer: Self-pay | Admitting: Family Medicine

## 2024-03-08 ENCOUNTER — Other Ambulatory Visit (HOSPITAL_COMMUNITY): Payer: Self-pay

## 2024-03-08 NOTE — Assessment & Plan Note (Signed)
 Patient reports that she has been able to be consistent with her usage of CPAP as well as Zepbound .  She needs a refill of the Zepbound  today.  Denies any significant GI side effects such as nausea, diarrhea, or constipation.  Will continue on current dose of Zepbound .

## 2024-03-08 NOTE — Telephone Encounter (Signed)
 PA

## 2024-03-09 ENCOUNTER — Other Ambulatory Visit (HOSPITAL_COMMUNITY): Payer: Self-pay

## 2024-03-09 NOTE — Telephone Encounter (Signed)
 PA started

## 2024-03-09 NOTE — Telephone Encounter (Signed)
 Pharmacy Patient Advocate Encounter  Received notification from [facility] that Prior Authorization for  Zepbound  12.5MG /0.5ML pen-injectors  has been DENIED.  See denial reason below. No denial letter attached in CMM. Will attach denial letter to Media tab once received.   PA #/Case ID/Reference #: [id number]    *I uploaded te sleep study showing OSA and an AHI of 30, routing to appeals team

## 2024-03-10 ENCOUNTER — Other Ambulatory Visit (HOSPITAL_COMMUNITY): Payer: Self-pay

## 2024-03-18 ENCOUNTER — Inpatient Hospital Stay
Admission: RE | Admit: 2024-03-18 | Discharge: 2024-03-18 | Attending: Obstetrics and Gynecology | Admitting: Obstetrics and Gynecology

## 2024-03-18 DIAGNOSIS — Z1231 Encounter for screening mammogram for malignant neoplasm of breast: Secondary | ICD-10-CM

## 2024-04-19 ENCOUNTER — Encounter (INDEPENDENT_AMBULATORY_CARE_PROVIDER_SITE_OTHER): Payer: Self-pay

## 2024-04-20 ENCOUNTER — Ambulatory Visit (INDEPENDENT_AMBULATORY_CARE_PROVIDER_SITE_OTHER): Admitting: Family Medicine

## 2024-04-20 ENCOUNTER — Encounter (INDEPENDENT_AMBULATORY_CARE_PROVIDER_SITE_OTHER): Payer: Self-pay | Admitting: Family Medicine

## 2024-04-20 VITALS — BP 107/77 | HR 77 | Temp 98.1°F | Ht 69.0 in | Wt 276.0 lb

## 2024-04-20 DIAGNOSIS — G4733 Obstructive sleep apnea (adult) (pediatric): Secondary | ICD-10-CM | POA: Diagnosis not present

## 2024-04-20 DIAGNOSIS — Z6841 Body Mass Index (BMI) 40.0 and over, adult: Secondary | ICD-10-CM

## 2024-04-20 NOTE — Progress Notes (Signed)
 "  Office: [phone]  /  Fax: [phone]  WEIGHT SUMMARY AND BIOMETRICS  Anthropometric Measurements Height: 5' 9 (1.753 m) Weight: 276 lb (125.2 kg) BMI (Calculated): 40.74 Weight at Last Visit: 279 lb Weight Lost Since Last Visit: 3 lb Weight Gained Since Last Visit: 0 Starting Weight: 305 lb Total Weight Loss (lbs): 29 lb (13.2 kg) Peak Weight: 319 lb   Body Composition  Body Fat %: 48.2 % Fat Mass (lbs): 133.2 lbs Muscle Mass (lbs): 136 lbs Total Body Water (lbs): 95.6 lbs Visceral Fat Rating : 15   Other Clinical Data Fasting: yes Labs: no Today's Visit #: 23 Starting Date: 08/26/22 Comments: cat 3, [phone]    Chief Complaint: OBESITY    History of Present Illness Katherine Conway is a 54 year old female with obesity and obstructive sleep apnea who presents for assessment of her treatment plan.  She has been recently started on Zepbound  for obstructive sleep apnea, with a current dose of 12.5 mg. She experiences no side effects from the medication and uses a CPAP machine without any issues.  For obesity management, she follows a category three eating plan, aiming for 450-550 calories and 40 or more grams of protein for dinner. She adheres to this plan about 50% of the time and is working on increasing her intake of whole foods and protein. She has lost three pounds over the last six weeks, including the holiday period.  Her exercise routine includes working out about four days a week for 40 minutes, combining strength and cardio exercises. She works with a systems analyst and engages in activities such as pushups, crunches, weights, ropes, step jacks, and mini squats. She reports no difficulty in meeting her protein goals and feels she is in a good space after the holidays, having made better choices and practiced portion control.      PHYSICAL EXAM:  Blood pressure 107/77, pulse 77, temperature 98.1 F (36.7 C), height 5' 9 (1.753 m), weight 276  lb (125.2 kg), last menstrual period 03/31/2013, SpO2 94%. Body mass index is 40.76 kg/m.  DIAGNOSTIC DATA REVIEWED BY MYSELF TODAY:  BMET    Component Value Date/Time   NA 141 02/15/2024 0922   K 4.5 02/15/2024 0922   CL 108 (H) 02/15/2024 0922   CO2 22 02/15/2024 0922   GLUCOSE 87 02/15/2024 0922   BUN 17 02/15/2024 0922   CREATININE 0.86 02/15/2024 0922   CALCIUM 8.6 (L) 02/15/2024 0922   GFRNONAA 73 02/07/2020 0000   GFRAA 85 02/07/2020 0000   Lab Results  Component Value Date   HGBA1C 5.2 02/15/2024   HGBA1C 5.9 09/05/2015   Lab Results  Component Value Date   INSULIN  5.2 03/30/2023   INSULIN  7.7 07/30/2020   Lab Results  Component Value Date   TSH 1.630 08/11/2023   CBC    Component Value Date/Time   WBC 7.9 08/11/2023 1440   RBC 4.25 08/11/2023 1440   RBC 4.2 02/07/2020 0000   HGB 13.5 08/11/2023 1440   HCT 39.9 08/11/2023 1440   PLT 224 08/11/2023 1440   MCV 94 08/11/2023 1440   MCH 31.8 08/11/2023 1440   MCHC 33.8 08/11/2023 1440   RDW 13.1 08/11/2023 1440   Iron Studies No results found for: IRON, TIBC, FERRITIN, IRONPCTSAT Lipid Panel     Component Value Date/Time   CHOL 171 02/15/2024 0922   TRIG 45 02/15/2024 0922   HDL 56 02/15/2024 0922   CHOLHDL 3.1 02/15/2024 0922   LDLCALC  106 (H) 02/15/2024 0922   Hepatic Function Panel     Component Value Date/Time   PROT 6.7 02/15/2024 0922   ALBUMIN 4.3 02/15/2024 0922   AST 17 02/15/2024 0922   ALT 19 02/15/2024 0922   ALKPHOS 76 02/15/2024 0922   BILITOT 0.6 02/15/2024 0922   BILIDIR 0.14 02/07/2020 0000      Component Value Date/Time   TSH 1.630 08/11/2023 1440   Nutritional Lab Results  Component Value Date   VD25OH 54.1 02/15/2024   VD25OH 77.1 03/30/2023   VD25OH 40.8 08/26/2022     Assessment and Plan Assessment & Plan Morbid obesity -improving Managed with Zepbound , a GLP-1 receptor agonist, and lifestyle modifications. She has lost three pounds over the last  six weeks, including during the holidays. She follows a category three eating plan with a focus on protein intake and exercises four days a week with a combination of strength and cardio. She reports no side effects from Zepbound  and feels she is meeting her protein goals more consistently than her calorie goals. She is working with a systems analyst and has a structured exercise routine. - Continue Zepbound  as prescribed. - Maintain current exercise routine with personal trainer. - Continue category three eating plan with focus on protein intake. - Scheduled follow-up appointment in 4-6 weeks.  Obstructive sleep apnea Managed with CPAP therapy. She reports no issues with her CPAP machine and is compliant with its use. Stable. - Continue CPAP therapy. - Continue Zepbound  as prescribed - Continue diet, exercise and weight loss as discussed today as an important part of the treatment plan       Patients who are on anti-obesity medications are counseled on the importance of maintaining healthy lifestyle habits, including balanced nutrition, regular physical activity, and behavioral modifications,  Medication is an adjunct to, not a replacement for, lifestyle changes and that the long-term success and weight maintenance depend on continued adherence to these strategies.   Peggie was informed of the importance of frequent follow up visits to maximize her success with intensive lifestyle modifications for her obesity and obesity related health conditions as recommended by USPSTF and CMS guidelines   Louann Penton, MD   "

## 2024-04-28 ENCOUNTER — Telehealth: Payer: 59 | Admitting: Adult Health

## 2024-05-31 ENCOUNTER — Ambulatory Visit (INDEPENDENT_AMBULATORY_CARE_PROVIDER_SITE_OTHER): Admitting: Family Medicine

## 2024-08-08 ENCOUNTER — Ambulatory Visit: Admitting: Adult Health

## 2024-08-17 ENCOUNTER — Encounter: Payer: Self-pay | Admitting: Internal Medicine
